# Patient Record
Sex: Female | Born: 1995 | Race: White | Hispanic: Yes | Marital: Single | Smoking: Never smoker
Health system: Southern US, Community
[De-identification: ages and names within clinical notes are randomized; demographics above are authoritative.]

## PROBLEM LIST (undated history)

## (undated) ENCOUNTER — Inpatient Hospital Stay (HOSPITAL_COMMUNITY): Payer: Self-pay

## (undated) DIAGNOSIS — E039 Hypothyroidism, unspecified: Secondary | ICD-10-CM

## (undated) DIAGNOSIS — E079 Disorder of thyroid, unspecified: Secondary | ICD-10-CM

## (undated) HISTORY — PX: NO PAST SURGERIES: SHX2092

## (undated) HISTORY — DX: Hypothyroidism, unspecified: E03.9

---
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
## 2015-09-18 ENCOUNTER — Encounter (HOSPITAL_COMMUNITY): Payer: Self-pay | Admitting: *Deleted

## 2015-09-18 ENCOUNTER — Inpatient Hospital Stay (HOSPITAL_COMMUNITY)
Admission: AD | Admit: 2015-09-18 | Discharge: 2015-09-18 | Disposition: A | Discharge status: Home / Self Care | Payer: Medicaid Other | Source: Ambulatory Visit | Attending: Family Medicine | Admitting: Family Medicine

## 2015-09-18 DIAGNOSIS — O9989 Other specified diseases and conditions complicating pregnancy, childbirth and the puerperium: Secondary | ICD-10-CM | POA: Diagnosis not present

## 2015-09-18 DIAGNOSIS — O26892 Other specified pregnancy related conditions, second trimester: Secondary | ICD-10-CM | POA: Diagnosis not present

## 2015-09-18 DIAGNOSIS — Z3A21 21 weeks gestation of pregnancy: Secondary | ICD-10-CM | POA: Diagnosis not present

## 2015-09-18 DIAGNOSIS — O99282 Endocrine, nutritional and metabolic diseases complicating pregnancy, second trimester: Secondary | ICD-10-CM | POA: Diagnosis not present

## 2015-09-18 DIAGNOSIS — M7989 Other specified soft tissue disorders: Secondary | ICD-10-CM

## 2015-09-18 DIAGNOSIS — Z3492 Encounter for supervision of normal pregnancy, unspecified, second trimester: Secondary | ICD-10-CM

## 2015-09-18 DIAGNOSIS — Z79899 Other long term (current) drug therapy: Secondary | ICD-10-CM | POA: Diagnosis not present

## 2015-09-18 DIAGNOSIS — E079 Disorder of thyroid, unspecified: Secondary | ICD-10-CM | POA: Insufficient documentation

## 2015-09-18 DIAGNOSIS — R609 Edema, unspecified: Secondary | ICD-10-CM | POA: Diagnosis present

## 2015-09-18 LAB — URINALYSIS, ROUTINE W REFLEX MICROSCOPIC
BILIRUBIN URINE: NEGATIVE
Glucose, UA: NEGATIVE mg/dL
HGB URINE DIPSTICK: NEGATIVE
KETONES UR: NEGATIVE mg/dL
NITRITE: NEGATIVE
PROTEIN: NEGATIVE mg/dL
Specific Gravity, Urine: 1.025 (ref 1.005–1.030)
pH: 6.5 (ref 5.0–8.0)

## 2015-09-18 LAB — URINE MICROSCOPIC-ADD ON: RBC / HPF: NONE SEEN RBC/hpf (ref 0–5)

## 2015-09-18 NOTE — MAU Note (Signed)
Hands and feet get swollen really bad.- thinks it is from her thyrod; going on for a month. Has been having cramps in her calves off and on the past month.  Has not started care, says they don't answer phone ([facility]).  Also has been having back pain.

## 2015-09-18 NOTE — Discharge Instructions (Signed)

## 2015-09-18 NOTE — MAU Provider Note (Signed)
History     CSN: [id number]  Arrival date and time: 09/18/15 1632     Chief Complaint  Patient presents with  . edema in hands and feet  . leg cramps  . Back Pain   HPI  Pt is a 20 y/o G1P0 at 21 wks. She reports several weeks of swelling in the hands face and legs. She reports the swelling is worse in the AM, but gets better as she moves around. No shortness of breath associated with her symptoms. She reports his been going on for some time. She has slightly skin so and about her thyroid disease. She however reports she has not been on medication for many years. She has not yet established care. One question she says gives [facility] both [facility] and both keeps saying there no longer taking patient's. She has no transportation to any other OB provider.  OB History    Gravida Para Term Preterm AB Living   1             SAB TAB Ectopic Multiple Live Births                  Past Medical History:  Diagnosis Date  . Thyroid disease     History reviewed. No pertinent surgical history.  History reviewed. No pertinent family history.  Social History  Substance Use Topics  . Smoking status: Never Smoker  . Smokeless tobacco: Never Used  . Alcohol use No    Allergies: No Known Allergies  Prescriptions Prior to Admission  Medication Sig Dispense Refill Last Dose  . levothyroxine (SYNTHROID, LEVOTHROID) 125 MCG tablet Take 125 mcg by mouth daily before breakfast.   09/17/2015 at Unknown time  . Prenatal Vit-Fe Fumarate-FA (PRENATAL MULTIVITAMIN) TABS tablet Take 1 tablet by mouth daily at 12 noon.   09/18/2015 at Unknown time    Review of Systems  Constitutional: Negative for chills and fever.  HENT: Negative for congestion.   Eyes: Negative for blurred vision and double vision.  Respiratory: Negative for cough, hemoptysis and sputum production.   Cardiovascular: Negative for chest pain  and palpitations.  Gastrointestinal: Negative for abdominal pain, heartburn, nausea and vomiting.  Genitourinary: Negative for dysuria and urgency.  Musculoskeletal: Negative for back pain and myalgias.  Skin: Negative for itching and rash.  Neurological: Negative for dizziness, tingling and headaches.  Endo/Heme/Allergies: Negative for environmental allergies. Does not bruise/bleed easily.  Psychiatric/Behavioral: Negative for depression and suicidal ideas.   Physical Exam   Blood pressure 100/58, pulse 86, temperature 98.1 F (36.7 C), temperature source Oral, resp. rate 16.  Physical Exam  Vitals reviewed. Constitutional: She is oriented to person, place, and time. She appears well-developed and well-nourished.  HENT:  Head: Normocephalic and atraumatic.  Cardiovascular: Normal rate, regular rhythm and normal heart sounds.   No murmur heard. Respiratory: Effort normal and breath sounds normal. No respiratory distress.  GI: Soft. Bowel sounds are normal. She exhibits no distension.  Gravid  Musculoskeletal:  1+ pitting nonpitting edema of the bilateral lower extremities. Minimal swelling of the bilateral hands noted as well.  Neurological: She is alert and oriented to person, place, and time.  Skin: Skin is warm and dry.  Psychiatric: She has a normal mood and affect. Her behavior is normal.    MAU Course  Procedures  MDM In the MAU discussion was had with the patient about her physical condition and the  fact that she'll likely has swelling secondary to pregnancy. We discussed things she can do to help a little swelling including low-salt intake and keeping her legs elevated. Patient voiced understanding and was satisfied with this answered.  Assessment and Plan  Lower extremity swelling bilateral: This is likely secondary to pregnancy. Recommended elevation of extremities when not in use and low-salt diet. Did discuss with patient need to establish care. After discussion she  said she is having trouble getting in touch with the OB providers. A message was sent to [facility] to decipher whether she can be seen here versus at the health department.  Ernestina Pennaicholas Jozette Castrellon 09/18/2015, 6:22 PM

## 2015-09-21 ENCOUNTER — Encounter: Payer: Self-pay | Admitting: *Deleted

## 2015-10-02 ENCOUNTER — Encounter: Payer: Medicaid Other | Admitting: Family Medicine

## 2015-10-18 ENCOUNTER — Ambulatory Visit (INDEPENDENT_AMBULATORY_CARE_PROVIDER_SITE_OTHER): Payer: Medicaid Other | Admitting: Certified Nurse Midwife

## 2015-10-18 ENCOUNTER — Encounter: Payer: Self-pay | Admitting: Certified Nurse Midwife

## 2015-10-18 VITALS — BP 105/64 | HR 62 | Ht 62.0 in | Wt 162.0 lb

## 2015-10-18 DIAGNOSIS — Z3402 Encounter for supervision of normal first pregnancy, second trimester: Secondary | ICD-10-CM

## 2015-10-18 DIAGNOSIS — O99282 Endocrine, nutritional and metabolic diseases complicating pregnancy, second trimester: Secondary | ICD-10-CM

## 2015-10-18 DIAGNOSIS — O0932 Supervision of pregnancy with insufficient antenatal care, second trimester: Secondary | ICD-10-CM

## 2015-10-18 DIAGNOSIS — R609 Edema, unspecified: Secondary | ICD-10-CM

## 2015-10-18 DIAGNOSIS — Z3689 Encounter for other specified antenatal screening: Secondary | ICD-10-CM

## 2015-10-18 DIAGNOSIS — O0930 Supervision of pregnancy with insufficient antenatal care, unspecified trimester: Secondary | ICD-10-CM | POA: Insufficient documentation

## 2015-10-18 DIAGNOSIS — O0990 Supervision of high risk pregnancy, unspecified, unspecified trimester: Secondary | ICD-10-CM | POA: Insufficient documentation

## 2015-10-18 DIAGNOSIS — E039 Hypothyroidism, unspecified: Secondary | ICD-10-CM | POA: Insufficient documentation

## 2015-10-18 DIAGNOSIS — O99280 Endocrine, nutritional and metabolic diseases complicating pregnancy, unspecified trimester: Secondary | ICD-10-CM

## 2015-10-18 DIAGNOSIS — Z23 Encounter for immunization: Secondary | ICD-10-CM | POA: Diagnosis present

## 2015-10-18 LAB — POCT URINALYSIS DIP (DEVICE)
BILIRUBIN URINE: NEGATIVE
Glucose, UA: NEGATIVE mg/dL
Hgb urine dipstick: NEGATIVE
KETONES UR: NEGATIVE mg/dL
NITRITE: NEGATIVE
PH: 6.5 (ref 5.0–8.0)
Protein, ur: NEGATIVE mg/dL
SPECIFIC GRAVITY, URINE: 1.02 (ref 1.005–1.030)
Urobilinogen, UA: 0.2 mg/dL (ref 0.0–1.0)

## 2015-10-18 LAB — TSH

## 2015-10-18 NOTE — Progress Notes (Signed)
New OB packet given

## 2015-10-18 NOTE — Progress Notes (Addendum)
Subjective:  Terri Glenn is a 20 y.o. G1P0000 at 7466w2d being seen today for initial prenatal care.  She is currently monitored for the following issues for this low-risk pregnancy and has Supervision of normal first pregnancy in second trimester; Hypothyroidism affecting pregnancy, antepartum; Dependent edema; and Late prenatal care affecting pregnancy, antepartum on her problem list.  Patient reports edema in feet x1 month, seen in MAU.Marland Kitchen.  Contractions: Not present. Vag. Bleeding: None.  Movement: Present. Denies leaking of fluid.   The following portions of the patient's history were reviewed and updated as appropriate: allergies, current medications, past family history, past medical history, past social history, past surgical history and problem list. Problem list updated.  Objective:   Vitals:   10/18/15 0808 10/18/15 0809  BP: 105/64   Pulse: 62   Weight: 162 lb (73.5 kg)   Height:  5\' 2"  (1.575 m)    Fetal Status: Fetal Heart Rate (bpm): 142 Fundal Height: 28 cm Movement: Present     General:  Alert, oriented and cooperative. Patient is in no acute distress.  Skin: Skin is warm and dry. No rash noted.   Cardiovascular: Normal heart rate noted, RRR  Respiratory: Normal respiratory effort, no problems with respiration noted, CTAB  Abdomen: Soft, gravid, appropriate for gestational age. Pain/Pressure: Present     Pelvic: Vag. Bleeding: None     Cervical exam deferred        Extremities: Normal range of motion.  Edema: Mild pitting, slight indentation  Mental Status: Normal mood and affect. Normal behavior. Normal judgment and thought content.   Urinalysis: Urine Protein: Negative Urine Glucose: Negative  Assessment and Plan:  Pregnancy: G1P0000 at 1166w2d  1. Supervision of normal first pregnancy in second trimester - Prenatal Profile - Hemoglobinopathy Evaluation - Culture, OB Urine - Pain Mgmt, Profile 6 Conf w/o mM, U - Flu Vaccine QUAD 36+ mos IM (Fluarix, Quad  PF) - watch weight gain  2. Encounter for fetal anatomic survey  - US MFM OB COMP + 14 WK; Future  3. Hypothyroidism affecting pregnancy, antepartum, second trimester - TSH  4. Dependent edema -increase water intake -elevate legs -compression stockings  5. Late prenatal care  Preterm labor symptoms and general obstetric precautions including but not limited to vaginal bleeding, contractions, leaking of fluid and fetal movement were reviewed in detail with the patient. Please refer to After Visit Summary for other counseling recommendations.  Return in about 2 weeks (around 11/01/2015).   Donette LarryMelanie Seara Hinesley, CNM

## 2015-10-18 NOTE — Addendum Note (Signed)
Addended by: Kathee DeltonHILLMAN, Andrick Rust L on: 10/18/2015 10:21 AM   Modules accepted: Orders

## 2015-10-19 LAB — PAIN MGMT, PROFILE 6 CONF W/O MM, U
6 ACETYLMORPHINE: NEGATIVE ng/mL (ref <10)
AMPHETAMINES: NEGATIVE ng/mL (ref <500)
Alcohol Metabolites: NEGATIVE ng/mL (ref <500)
Barbiturates: NEGATIVE ng/mL (ref <300)
Benzodiazepines: NEGATIVE ng/mL (ref <100)
COCAINE METABOLITE: NEGATIVE ng/mL (ref <150)
Creatinine: 93.2 mg/dL (ref >=20.0)
MARIJUANA METABOLITE: NEGATIVE ng/mL (ref <20)
METHADONE METABOLITE: NEGATIVE ng/mL (ref <100)
OPIATES: NEGATIVE ng/mL (ref <100)
OXYCODONE: NEGATIVE ng/mL (ref <100)
Oxidant: NEGATIVE ug/mL (ref <200)
PHENCYCLIDINE: NEGATIVE ng/mL (ref <25)
PLEASE NOTE: 0
pH: 6.67 (ref 4.5–9.0)

## 2015-10-19 LAB — PRENATAL PROFILE (SOLSTAS)
Antibody Screen: NEGATIVE
Basophils Absolute: 0 cells/uL (ref 0–200)
Basophils Relative: 0 %
EOS PCT: 2 %
Eosinophils Absolute: 136 cells/uL (ref 15–500)
HEMATOCRIT: 30.2 % — AB (ref 35.0–45.0)
HEMOGLOBIN: 10 g/dL — AB (ref 11.7–15.5)
HEP B S AG: NEGATIVE
HIV 1&2 Ab, 4th Generation: NONREACTIVE
LYMPHS ABS: 1904 {cells}/uL (ref 850–3900)
Lymphocytes Relative: 28 %
MCH: 33.8 pg — AB (ref 27.0–33.0)
MCHC: 33.1 g/dL (ref 32.0–36.0)
MCV: 102 fL — ABNORMAL HIGH (ref 80.0–100.0)
MONO ABS: 476 {cells}/uL (ref 200–950)
MPV: 10.5 fL (ref 7.5–12.5)
Monocytes Relative: 7 %
NEUTROS PCT: 63 %
Neutro Abs: 4284 cells/uL (ref 1500–7800)
Platelets: 244 10*3/uL (ref 140–400)
RBC: 2.96 MIL/uL — AB (ref 3.80–5.10)
RDW: 13.9 % (ref 11.0–15.0)
Rh Type: POSITIVE
Rubella: <0.9 Index (ref <0.90)
WBC: 6.8 10*3/uL (ref 3.8–10.8)

## 2015-10-19 LAB — CULTURE, OB URINE

## 2015-10-19 LAB — GLUCOSE TOLERANCE, 1 HOUR (50G) W/O FASTING: Glucose, 1 Hr, gestational: 118 mg/dL (ref <140)

## 2015-10-22 LAB — HEMOGLOBINOPATHY EVALUATION
HEMATOCRIT: 30.2 % — AB (ref 35.0–45.0)
HEMOGLOBIN: 10 g/dL — AB (ref 11.7–15.5)
HGB A2 QUANT: 2.4 % (ref 1.8–3.5)
HGB A: 96.6 % (ref >96.0)
Hgb F Quant: <1 % (ref <2.0)
MCH: 33.8 pg — ABNORMAL HIGH (ref 27.0–33.0)
MCV: 102 fL — ABNORMAL HIGH (ref 80.0–100.0)
RBC: 2.96 MIL/uL — ABNORMAL LOW (ref 3.80–5.10)
RDW: 13.9 % (ref 11.0–15.0)

## 2015-10-26 ENCOUNTER — Other Ambulatory Visit: Payer: Self-pay | Admitting: Certified Nurse Midwife

## 2015-10-26 ENCOUNTER — Other Ambulatory Visit: Payer: Medicaid Other

## 2015-10-26 ENCOUNTER — Ambulatory Visit (HOSPITAL_COMMUNITY)
Admission: RE | Admit: 2015-10-26 | Discharge: 2015-10-26 | Disposition: A | Discharge status: Home / Self Care | Payer: Medicaid Other | Source: Ambulatory Visit | Attending: Certified Nurse Midwife | Admitting: Certified Nurse Midwife

## 2015-10-26 ENCOUNTER — Telehealth: Payer: Self-pay | Admitting: *Deleted

## 2015-10-26 DIAGNOSIS — O99283 Endocrine, nutritional and metabolic diseases complicating pregnancy, third trimester: Secondary | ICD-10-CM | POA: Diagnosis not present

## 2015-10-26 DIAGNOSIS — O99282 Endocrine, nutritional and metabolic diseases complicating pregnancy, second trimester: Secondary | ICD-10-CM

## 2015-10-26 DIAGNOSIS — O0992 Supervision of high risk pregnancy, unspecified, second trimester: Secondary | ICD-10-CM

## 2015-10-26 DIAGNOSIS — Z3A26 26 weeks gestation of pregnancy: Secondary | ICD-10-CM

## 2015-10-26 DIAGNOSIS — O0932 Supervision of pregnancy with insufficient antenatal care, second trimester: Secondary | ICD-10-CM

## 2015-10-26 DIAGNOSIS — Z36 Encounter for antenatal screening of mother: Secondary | ICD-10-CM | POA: Insufficient documentation

## 2015-10-26 DIAGNOSIS — E039 Hypothyroidism, unspecified: Secondary | ICD-10-CM

## 2015-10-26 DIAGNOSIS — Z1389 Encounter for screening for other disorder: Secondary | ICD-10-CM

## 2015-10-26 DIAGNOSIS — Z3A28 28 weeks gestation of pregnancy: Secondary | ICD-10-CM | POA: Insufficient documentation

## 2015-10-26 DIAGNOSIS — E079 Disorder of thyroid, unspecified: Secondary | ICD-10-CM

## 2015-10-26 DIAGNOSIS — Z3689 Encounter for other specified antenatal screening: Secondary | ICD-10-CM

## 2015-10-26 LAB — T4, FREE: FREE T4: 0.3 ng/dL — AB (ref 0.8–1.4)

## 2015-10-26 MED ORDER — LEVOTHYROXINE SODIUM 100 MCG PO TABS: 100.0000 ug | ORAL_TABLET | Freq: Every day | ORAL | 1 refills | Status: DC

## 2015-10-26 NOTE — Telephone Encounter (Signed)
Called pt w/interpreter Terri Glenn and spoke w/pt's mother who stated that [facility]. I went to MFM to speak w/pt and advised her of abnormal test result requiring additional labs needed and that she needed to start on medication for her thyroid.  Pt voiced understanding and agreed to come to office today for lab draw.  Rx sent to her pharmacy.

## 2015-10-27 LAB — T3: T3, Total: 26 ng/dL — ABNORMAL LOW (ref 86–192)

## 2015-10-30 ENCOUNTER — Other Ambulatory Visit (HOSPITAL_COMMUNITY): Payer: Self-pay | Admitting: *Deleted

## 2015-10-30 DIAGNOSIS — E039 Hypothyroidism, unspecified: Secondary | ICD-10-CM

## 2015-10-30 DIAGNOSIS — O99280 Endocrine, nutritional and metabolic diseases complicating pregnancy, unspecified trimester: Principal | ICD-10-CM

## 2015-11-06 ENCOUNTER — Ambulatory Visit (INDEPENDENT_AMBULATORY_CARE_PROVIDER_SITE_OTHER): Payer: Medicaid Other | Admitting: Student

## 2015-11-06 VITALS — BP 109/64 | HR 67 | Wt 166.2 lb

## 2015-11-06 DIAGNOSIS — Z283 Underimmunization status: Secondary | ICD-10-CM

## 2015-11-06 DIAGNOSIS — O99280 Endocrine, nutritional and metabolic diseases complicating pregnancy, unspecified trimester: Secondary | ICD-10-CM

## 2015-11-06 DIAGNOSIS — O99283 Endocrine, nutritional and metabolic diseases complicating pregnancy, third trimester: Secondary | ICD-10-CM

## 2015-11-06 DIAGNOSIS — O0990 Supervision of high risk pregnancy, unspecified, unspecified trimester: Secondary | ICD-10-CM

## 2015-11-06 DIAGNOSIS — O09899 Supervision of other high risk pregnancies, unspecified trimester: Secondary | ICD-10-CM | POA: Insufficient documentation

## 2015-11-06 DIAGNOSIS — O9989 Other specified diseases and conditions complicating pregnancy, childbirth and the puerperium: Secondary | ICD-10-CM

## 2015-11-06 DIAGNOSIS — E039 Hypothyroidism, unspecified: Secondary | ICD-10-CM

## 2015-11-06 DIAGNOSIS — Z2839 Other underimmunization status: Secondary | ICD-10-CM

## 2015-11-06 NOTE — Progress Notes (Signed)
   PRENATAL VISIT NOTE  Subjective:  Terri Glenn is a 20 y.o. G1P0000 at 6759w0d being seen today for ongoing prenatal care.  She is currently monitored for the following issues for this high-risk pregnancy and has Supervision of high risk pregnancy, antepartum; Hypothyroidism affecting pregnancy, antepartum; Dependent edema; Late prenatal care affecting pregnancy, antepartum; and Rubella non-immune status, antepartum on her problem list.  Patient reports no complaints.  Contractions: Not present.  .  Movement: Present. Denies leaking of fluid.   The following portions of the patient's history were reviewed and updated as appropriate: allergies, current medications, past family history, past medical history, past social history, past surgical history and problem list. Problem list updated.  Objective:   Vitals:   11/06/15 1119  BP: 109/64  Pulse: 67  Weight: 166 lb 3.2 oz (75.4 kg)    Fetal Status: Fetal Heart Rate (bpm): 134   Movement: Present   Fundal height 29 cm  General:  Alert, oriented and cooperative. Patient is in no acute distress.  Skin: Skin is warm and dry. No rash noted.   Cardiovascular: Normal heart rate noted  Respiratory: Normal respiratory effort, no problems with respiration noted  Abdomen: Soft, gravid, appropriate for gestational age. Pain/Pressure: Present     Pelvic:  Cervical exam deferred        Extremities: Normal range of motion.   BLE 1+  Mental Status: Normal mood and affect. Normal behavior. Normal judgment and thought content.   Urinalysis:      Assessment and Plan:  Pregnancy: G1P0000 at 10059w0d  1. Supervision of high risk pregnancy, antepartum   2. Hypothyroidism affecting pregnancy, antepartum -pt currently taking synthroid -needs repeat TSH next visit  3. Rubella non-immune status, antepartum -needs postpartum  Preterm labor symptoms and general obstetric precautions including but not limited to vaginal bleeding, contractions,  leaking of fluid and fetal movement were reviewed in detail with the patient. Please refer to After Visit Summary for other counseling recommendations.  Return in about 2 weeks (around 11/20/2015) for Routine OB.  Judeth HornErin [place] Severs, NP

## 2015-11-06 NOTE — Patient Instructions (Signed)
Hypothyroidism and Pregnancy Hypothyroidism is a condition that develops if you have an underactive thyroid gland. The thyroid is a small, butterfly-shaped gland in your neck, and it is located in front of your windpipe. It makes the thyroid hormones triiodothyronine (T3) and thyroxine (T4). These hormones play an important role in regulating your breathing, heart rate, menstrual cycle, body temperature, and other bodily functions. If you have hypothyroidism, your thyroid gland does not produce enough thyroid hormones. When you are pregnant, your body uses more thyroid hormones. This can cause mild hypothyroidism to get worse. Hypothyroidism during pregnancy can lead to complications, including high blood pressure that develops after the 20th week of pregnancy (preeclampsia). Hypothyroidism can also affect your baby. Babies need thyroid hormone from their mothers for normal growth and brain development. Babies born to mothers with hypothyroidism during pregnancy may be born prematurely and have lower mental abilities. SYMPTOMS Symptoms of hypothyroidism can develop slowly. Symptoms include:   Fatigue.  Weight gain.  Constipation.  Feeling cold more often than others do.  Muscle aches. DIAGNOSIS Your health care provider may suspect hypothyroidism based on your symptoms. The health care provider will also do a physical exam to check your neck. He or she will do this while you swallow so that it will be easier to feel your thyroid gland. You may also have tests to confirm the diagnosis, including:   Blood tests.  An imaging study using sound waves and a computer (ultrasound). TREATMENT Hypothyroid treatment during pregnancy includes:   Monitoring. If you have mild hypothyroidism, your health care provider will monitor your thyroid hormone levels closely to watch for any changes.  Medicine prescribed by your health care provider to control your thyroid hormone levels. HOME CARE  INSTRUCTIONS  Take medicines only as directed by your health care provider. Check with your health care provider before taking any hypothyroid medicines that were prescribed before you became pregnant. Many are safe, but some treatments for hypothyroidism may have to be stopped during pregnancy.  Some women need extra iodine during pregnancy. Ask your health care provider whether you should:  Get more iodine in your diet.  Take a prenatal vitamin containing iodine.  Take iodine supplements. SEEK MEDICAL CARE IF:  You notice the onset of hypothyroidism symptoms that you did not have before.  You gain more than 5 lb (2.3 kg) in 1 week. For women at a normal weight, it is normal to gain about 1 pound per week during pregnancy.  You have a lump in your neck.  You have a scratchy throat or difficulty speaking that lasts longer than a month and is not related to a cold.  You have a hard time swallowing. SEEK IMMEDIATE MEDICAL CARE IF:  Your baby is less active than normal. You may be asked to perform kick counts to monitor your baby's movements. If your baby moves fewer than 10 times in 2 hours during a period when the baby is usually active (typically in the evening), you should see your health care provider right away.  Your baby stops moving completely.  You develop muscle cramps.  You have belly pain.  You have heavy bleeding.  You develop a fever or chills.  You have a very bad headache or vision problems.  You develop swelling in your legs and ankles.   This information is not intended to replace advice given to you by your health care provider. Make sure you discuss any questions you have with your health care provider.  Document Released: 11/10/2006 Document Revised: 02/03/2014 Document Reviewed: 06/15/2013 Elsevier Interactive Patient Education [employer]

## 2015-11-23 ENCOUNTER — Ambulatory Visit (HOSPITAL_COMMUNITY)
Admission: RE | Admit: 2015-11-23 | Discharge: 2015-11-23 | Disposition: A | Discharge status: Home / Self Care | Payer: Medicaid Other | Source: Ambulatory Visit | Attending: Certified Nurse Midwife | Admitting: Certified Nurse Midwife

## 2015-11-23 ENCOUNTER — Encounter (HOSPITAL_COMMUNITY): Payer: Self-pay

## 2015-11-23 DIAGNOSIS — O99283 Endocrine, nutritional and metabolic diseases complicating pregnancy, third trimester: Secondary | ICD-10-CM | POA: Insufficient documentation

## 2015-11-23 DIAGNOSIS — Z362 Encounter for other antenatal screening follow-up: Secondary | ICD-10-CM | POA: Insufficient documentation

## 2015-11-23 DIAGNOSIS — O9989 Other specified diseases and conditions complicating pregnancy, childbirth and the puerperium: Secondary | ICD-10-CM

## 2015-11-23 DIAGNOSIS — Z283 Underimmunization status: Secondary | ICD-10-CM

## 2015-11-23 DIAGNOSIS — E039 Hypothyroidism, unspecified: Secondary | ICD-10-CM | POA: Diagnosis not present

## 2015-11-23 DIAGNOSIS — O0990 Supervision of high risk pregnancy, unspecified, unspecified trimester: Secondary | ICD-10-CM

## 2015-11-23 DIAGNOSIS — R609 Edema, unspecified: Secondary | ICD-10-CM

## 2015-11-23 DIAGNOSIS — Z3A32 32 weeks gestation of pregnancy: Secondary | ICD-10-CM | POA: Diagnosis not present

## 2015-11-23 DIAGNOSIS — O99280 Endocrine, nutritional and metabolic diseases complicating pregnancy, unspecified trimester: Secondary | ICD-10-CM

## 2015-11-23 DIAGNOSIS — O4103X0 Oligohydramnios, third trimester, not applicable or unspecified: Secondary | ICD-10-CM | POA: Diagnosis not present

## 2015-11-23 DIAGNOSIS — Z2839 Other underimmunization status: Secondary | ICD-10-CM

## 2015-11-23 DIAGNOSIS — O0930 Supervision of pregnancy with insufficient antenatal care, unspecified trimester: Secondary | ICD-10-CM

## 2015-11-26 ENCOUNTER — Ambulatory Visit (INDEPENDENT_AMBULATORY_CARE_PROVIDER_SITE_OTHER): Payer: Medicaid Other | Admitting: Obstetrics and Gynecology

## 2015-11-26 VITALS — BP 99/62 | HR 64 | Wt 165.6 lb

## 2015-11-26 DIAGNOSIS — E032 Hypothyroidism due to medicaments and other exogenous substances: Secondary | ICD-10-CM

## 2015-11-26 DIAGNOSIS — O99283 Endocrine, nutritional and metabolic diseases complicating pregnancy, third trimester: Secondary | ICD-10-CM

## 2015-11-26 DIAGNOSIS — O99013 Anemia complicating pregnancy, third trimester: Secondary | ICD-10-CM

## 2015-11-26 DIAGNOSIS — O329XX0 Maternal care for malpresentation of fetus, unspecified, not applicable or unspecified: Secondary | ICD-10-CM | POA: Insufficient documentation

## 2015-11-26 DIAGNOSIS — D531 Other megaloblastic anemias, not elsewhere classified: Secondary | ICD-10-CM

## 2015-11-26 LAB — VITAMIN B12: Vitamin B-12: 218 pg/mL (ref 200–1100)

## 2015-11-26 LAB — TSH: TSH: 64.85 mIU/L — ABNORMAL HIGH

## 2015-11-26 LAB — FOLATE: Folate: 20.4 ng/mL (ref >5.4)

## 2015-11-26 NOTE — Progress Notes (Signed)
Prenatal Visit Note Date: 11/26/2015 Clinic: [facility]  Subjective:  Terri Glenn is a 20 y.o. G1P0000 at 4367w6d being seen today for ongoing prenatal care.  She is currently monitored for the following issues for this low-risk pregnancy and has Supervision of high risk pregnancy, antepartum; Hypothyroidism affecting pregnancy, antepartum; Dependent edema; Late prenatal care affecting pregnancy, antepartum; and Rubella non-immune status, antepartum on her problem list.  Patient reports no complaints.   Contractions: Not present.  .  Movement: Present. Denies leaking of fluid.   The following portions of the patient's history were reviewed and updated as appropriate: allergies, current medications, past family history, past medical history, past social history, past surgical history and problem list. Problem list updated.  Objective:   Vitals:   11/26/15 1111  BP: 99/62  Pulse: 64  Weight: 165 lb 9.6 oz (75.1 kg)    Fetal Status: Fetal Heart Rate (bpm): 136 Fundal Height: 33 cm Movement: Present  Presentation: Homero FellersFrank Breech  General:  Alert, oriented and cooperative. Patient is in no acute distress.  Skin: Skin is warm and dry. No rash noted.   Cardiovascular: Normal heart rate noted  Respiratory: Normal respiratory effort, no problems with respiration noted  Abdomen: Soft, gravid, appropriate for gestational age. Pain/Pressure: Present     Pelvic:  Cervical exam deferred        Extremities: Normal range of motion.     Mental Status: Normal mood and affect. Normal behavior. Normal judgment and thought content.   Urinalysis:      Assessment and Plan:  Pregnancy: G1P0000 at 3867w6d  1. 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 F/u TSH and adjust prn - TSH  2. Megaloblastic anemia - B12 - Folate  3. Malpresentation Frank breech at normal growth scan on 10/27 and breech via leopolds today. F/u nv and d/w pt  potential plan of care.   Preterm labor symptoms and general obstetric precautions including but not limited to vaginal bleeding, contractions, leaking of fluid and fetal movement were reviewed in detail with the patient. Please refer to After Visit Summary for other counseling recommendations.  Return in about 2 weeks (around 12/10/2015).   [place] Bingharlie Keesha Pellum, MD

## 2015-11-27 ENCOUNTER — Other Ambulatory Visit: Payer: Self-pay | Admitting: Obstetrics and Gynecology

## 2015-11-27 MED ORDER — LEVOTHYROXINE SODIUM 25 MCG PO TABS: ORAL_TABLET | ORAL | 1 refills | Status: DC

## 2015-11-29 ENCOUNTER — Telehealth: Payer: Self-pay

## 2015-11-29 NOTE — Telephone Encounter (Signed)
Attempted to call patient but there was no answer or voicemail to leave a message. Make sure that she increases her synthroid to per day. i'll send in some tablets for her to the pharmacy.

## 2015-11-30 NOTE — Telephone Encounter (Signed)
Called patient to let her know of need to increase synthroid dose to total of 125 mcg. Prescription for extra 25 mcg can be picked up at her pharmacy. Patient voiced understanding.

## 2015-12-11 ENCOUNTER — Ambulatory Visit (INDEPENDENT_AMBULATORY_CARE_PROVIDER_SITE_OTHER): Payer: Medicaid Other | Admitting: Certified Nurse Midwife

## 2015-12-11 ENCOUNTER — Encounter: Payer: Medicaid Other | Admitting: Medical

## 2015-12-11 VITALS — BP 103/65 | HR 76 | Wt 165.8 lb

## 2015-12-11 DIAGNOSIS — Z23 Encounter for immunization: Secondary | ICD-10-CM

## 2015-12-11 DIAGNOSIS — O99280 Endocrine, nutritional and metabolic diseases complicating pregnancy, unspecified trimester: Principal | ICD-10-CM

## 2015-12-11 DIAGNOSIS — E039 Hypothyroidism, unspecified: Secondary | ICD-10-CM

## 2015-12-11 DIAGNOSIS — O99283 Endocrine, nutritional and metabolic diseases complicating pregnancy, third trimester: Secondary | ICD-10-CM

## 2015-12-11 DIAGNOSIS — O0990 Supervision of high risk pregnancy, unspecified, unspecified trimester: Secondary | ICD-10-CM

## 2015-12-11 MED ORDER — TETANUS-DIPHTH-ACELL PERTUSSIS 5-2.5-18.5 LF-MCG/0.5 IM SUSP
0.5000 mL | Freq: Once | INTRAMUSCULAR | Status: AC
  Administered 2015-12-11: 0.5 mL via INTRAMUSCULAR

## 2015-12-11 NOTE — Progress Notes (Signed)
Subjective:  Terri Glenn is a 20 y.o. G1P0000 at 510w0d being seen today for ongoing prenatal care.  She is currently monitored for the following issues for this low-risk pregnancy and has Supervision of high risk pregnancy, antepartum; Hypothyroidism affecting pregnancy, antepartum; Dependent edema; Late prenatal care affecting pregnancy, antepartum; Rubella non-immune status, antepartum; and Malpresentation before onset of labor on her problem list.  Patient reports no complaints.  Contractions: Not present. Vag. Bleeding: None.  Movement: Present. Denies leaking of fluid.   The following portions of the patient's history were reviewed and updated as appropriate: allergies, current medications, past family history, past medical history, past social history, past surgical history and problem list. Problem list updated.  Objective:   Vitals:   12/11/15 1448  BP: 103/65  Pulse: 76  Weight: 165 lb 12.8 oz (75.2 kg)    Fetal Status: Fetal Heart Rate (bpm): 141 Fundal Height: 35 cm Movement: Present  Presentation: Homero FellersFrank Breech  General:  Alert, oriented and cooperative. Patient is in no acute distress.  Skin: Skin is warm and dry. No rash noted.   Cardiovascular: Normal heart rate noted  Respiratory: Normal respiratory effort, no problems with respiration noted  Abdomen: Soft, gravid, appropriate for gestational age. Pain/Pressure: Present     Pelvic: Vag. Bleeding: None     Cervical exam deferred        Extremities: Normal range of motion.  Edema: None  Mental Status: Normal mood and affect. Normal behavior. Normal judgment and thought content.   Urinalysis:      Assessment and Plan:  Pregnancy: G1P0000 at 810w0d  1. Hypothyroidism affecting pregnancy, antepartum - TSH 64.85, dose increased to 125 mcg (2 weeks ago)  2. Supervision of high risk pregnancy, antepartum  3.  Fetal malpresentation - recommend exercises from spinning babies website - discussed options if breech  position persists: ECV vs CS  Preterm labor symptoms and general obstetric precautions including but not limited to vaginal bleeding, contractions, leaking of fluid and fetal movement were reviewed in detail with the patient. Please refer to After Visit Summary for other counseling recommendations.  Return in about 1 week (around 12/18/2015).   Donette LarryMelanie Marcie Shearon, CNM

## 2015-12-26 ENCOUNTER — Other Ambulatory Visit (HOSPITAL_COMMUNITY)
Admission: RE | Admit: 2015-12-26 | Discharge: 2015-12-26 | Disposition: A | Discharge status: Home / Self Care | Payer: Medicaid Other | Source: Ambulatory Visit | Attending: Family Medicine | Admitting: Family Medicine

## 2015-12-26 ENCOUNTER — Ambulatory Visit (INDEPENDENT_AMBULATORY_CARE_PROVIDER_SITE_OTHER): Payer: Medicaid Other | Admitting: Family Medicine

## 2015-12-26 VITALS — BP 111/65 | HR 79 | Wt 170.0 lb

## 2015-12-26 DIAGNOSIS — O99283 Endocrine, nutritional and metabolic diseases complicating pregnancy, third trimester: Secondary | ICD-10-CM | POA: Diagnosis present

## 2015-12-26 DIAGNOSIS — Z3689 Encounter for other specified antenatal screening: Secondary | ICD-10-CM | POA: Diagnosis not present

## 2015-12-26 DIAGNOSIS — O321XX0 Maternal care for breech presentation, not applicable or unspecified: Secondary | ICD-10-CM

## 2015-12-26 DIAGNOSIS — Z113 Encounter for screening for infections with a predominantly sexual mode of transmission: Secondary | ICD-10-CM | POA: Insufficient documentation

## 2015-12-26 DIAGNOSIS — E039 Hypothyroidism, unspecified: Secondary | ICD-10-CM

## 2015-12-26 DIAGNOSIS — O0990 Supervision of high risk pregnancy, unspecified, unspecified trimester: Secondary | ICD-10-CM

## 2015-12-26 NOTE — Progress Notes (Addendum)
   PRENATAL VISIT NOTE  Subjective:  Terri Glenn is a 20 y.o. G1P0000 at 480w1d being seen today for ongoing prenatal care.  She is currently monitored for the following issues for this low-risk pregnancy and has Supervision of high risk pregnancy, antepartum; Hypothyroidism affecting pregnancy, antepartum; Dependent edema; Late prenatal care affecting pregnancy, antepartum; Rubella non-immune status, antepartum; and Malpresentation before onset of labor on her problem list.  Patient reports no complaints.  Contractions: Not present.  .  Movement: Present. Denies leaking of fluid.   The following portions of the patient's history were reviewed and updated as appropriate: allergies, current medications, past family history, past medical history, past social history, past surgical history and problem list. Problem list updated.  Objective:   Vitals:   12/26/15 1605  BP: 111/65  Pulse: 79  Weight: 170 lb (77.1 kg)    Fetal Status: Fetal Heart Rate (bpm): 139 Fundal Height: 36 cm Movement: Present  Presentation: Homero FellersFrank Breech  General:  Alert, oriented and cooperative. Patient is in no acute distress.  Skin: Skin is warm and dry. No rash noted.   Cardiovascular: Normal heart rate noted  Respiratory: Normal respiratory effort, no problems with respiration noted  Abdomen: Soft, gravid, appropriate for gestational age. Pain/Pressure: Present     Pelvic:  Cervical exam performed Dilation: 1.5 Effacement (%): 80 Station: -2  Extremities: Normal range of motion.     Mental Status: Normal mood and affect. Normal behavior. Normal judgment and thought content.   Limited OB u/s shows breech presentation Assessment and Plan:  Pregnancy: G1P0000 at 7180w1d  1. Supervision of high risk pregnancy, antepartum - Culture, beta strep (group b only) - GC/Chlamydia probe amp ([place])not at [facility]  2. Breech presentation with antenatal problem, single or unspecified fetus Offered ECV--patient  will consider and call back to let us know her decision.   Term labor symptoms and general obstetric precautions including but not limited to vaginal bleeding, contractions, leaking of fluid and fetal movement were reviewed in detail with the patient. Please refer to After Visit Summary for other counseling recommendations.  Return in about 1 week (around 01/02/2016).   Reva Boresanya S Livi Mcgann, MD

## 2015-12-26 NOTE — Patient Instructions (Signed)
Third Trimester of Pregnancy The third trimester is from week 29 through week 40 (months 7 through 9). The third trimester is a time when the unborn baby (fetus) is growing rapidly. At the end of the ninth month, the fetus is about 20 inches in length and weighs 6-10 pounds. Body changes during your third trimester Your body goes through many changes during pregnancy. The changes vary from woman to woman. During the third trimester:  Your weight will continue to increase. You can expect to gain 25-35 pounds (11-16 kg) by the end of the pregnancy.  You may begin to get stretch marks on your hips, abdomen, and breasts.  You may urinate more often because the fetus is moving lower into your pelvis and pressing on your bladder.  You may develop or continue to have heartburn. This is caused by increased hormones that slow down muscles in the digestive tract.  You may develop or continue to have constipation because increased hormones slow digestion and cause the muscles that push waste through your intestines to relax.  You may develop hemorrhoids. These are swollen veins (varicose veins) in the rectum that can itch or be painful.  You may develop swollen, bulging veins (varicose veins) in your legs.  You may have increased body aches in the pelvis, back, or thighs. This is due to weight gain and increased hormones that are relaxing your joints.  You may have changes in your hair. These can include thickening of your hair, rapid growth, and changes in texture. Some women also have hair loss during or after pregnancy, or hair that feels dry or thin. Your hair will most likely return to normal after your baby is born.  Your breasts will continue to grow and they will continue to become tender. A yellow fluid (colostrum) may leak from your breasts. This is the first milk you are producing for your baby.  Your belly button may stick out.  You may notice more swelling in your hands, face, or  ankles.  You may have increased tingling or numbness in your hands, arms, and legs. The skin on your belly may also feel numb.  You may feel short of breath because of your expanding uterus.  You may have more problems sleeping. This can be caused by the size of your belly, increased need to urinate, and an increase in your body's metabolism.  You may notice the fetus "dropping," or moving lower in your abdomen.  You may have increased vaginal discharge.  Your cervix becomes thin and soft (effaced) near your due date. What to expect at prenatal visits You will have prenatal exams every 2 weeks until week 36. Then you will have weekly prenatal exams. During a routine prenatal visit:  You will be weighed to make sure you and the fetus are growing normally.  Your blood pressure will be taken.  Your abdomen will be measured to track your baby's growth.  The fetal heartbeat will be listened to.  Any test results from the previous visit will be discussed.  You may have a cervical check near your due date to see if you have effaced. At around 36 weeks, your health care provider will check your cervix. At the same time, your health care provider will also perform a test on the secretions of the vaginal tissue. This test is to determine if a type of bacteria, Group B streptococcus, is present. Your health care provider will explain this further. Your health care provider may ask you:    What your birth plan is.  How you are feeling.  If you are feeling the baby move.  If you have had any abnormal symptoms, such as leaking fluid, bleeding, severe headaches, or abdominal cramping.  If you are using any tobacco products, including cigarettes, chewing tobacco, and electronic cigarettes.  If you have any questions. Other tests or screenings that may be performed during your third trimester include:  Blood tests that check for low iron levels (anemia).  Fetal testing to check the health,  activity level, and growth of the fetus. Testing is done if you have certain medical conditions or if there are problems during the pregnancy.  Nonstress test (NST). This test checks the health of your baby to make sure there are no signs of problems, such as the baby not getting enough oxygen. During this test, a belt is placed around your belly. The baby is made to move, and its heart rate is monitored during movement. What is false labor? False labor is a condition in which you feel small, irregular tightenings of the muscles in the womb (contractions) that eventually go away. These are called Braxton Hicks contractions. Contractions may last for hours, days, or even weeks before true labor sets in. If contractions come at regular intervals, become more frequent, increase in intensity, or become painful, you should see your health care provider. What are the signs of labor?  Abdominal cramps.  Regular contractions that start at 10 minutes apart and become stronger and more frequent with time.  Contractions that start on the top of the uterus and spread down to the lower abdomen and back.  Increased pelvic pressure and dull back pain.  A watery or bloody mucus discharge that comes from the vagina.  Leaking of amniotic fluid. This is also known as your "water breaking." It could be a slow trickle or a gush. Let your doctor know if it has a color or strange odor. If you have any of these signs, call your health care provider right away, even if it is before your due date. Follow these instructions at home: Eating and drinking  Continue to eat regular, healthy meals.  Do not eat:  Raw meat or meat spreads.  Unpasteurized milk or cheese.  Unpasteurized juice.  Store-made salad.  Refrigerated smoked seafood.  Hot dogs or deli meat, unless they are piping hot.  More than 6 ounces of albacore tuna a week.  Shark, swordfish, king mackerel, or tile fish.  Store-made salads.  Raw  sprouts, such as mung bean or alfalfa sprouts.  Take prenatal vitamins as told by your health care provider.  Take 1000 mg of calcium daily as told by your health care provider.  If you develop constipation:  Take over-the-counter or prescription medicines.  Drink enough fluid to keep your urine clear or pale yellow.  Eat foods that are high in fiber, such as fresh fruits and vegetables, whole grains, and beans.  Limit foods that are high in fat and processed sugars, such as fried and sweet foods. Activity  Exercise only as directed by your health care provider. Healthy pregnant women should aim for 2 hours and 30 minutes of moderate exercise per week. If you experience any pain or discomfort while exercising, stop.  Avoid heavy lifting.  Do not exercise in extreme heat or humidity, or at high altitudes.  Wear low-heel, comfortable shoes.  Practice good posture.  Do not travel far distances unless it is absolutely necessary and only with the approval   of your health care provider.  Wear your seat belt at all times while in a car, on a bus, or on a plane.  Take frequent breaks and rest with your legs elevated if you have leg cramps or low back pain.  Do not use hot tubs, steam rooms, or saunas.  You may continue to have sex unless your health care provider tells you otherwise. Lifestyle  Do not use any products that contain nicotine or tobacco, such as cigarettes and e-cigarettes. If you need help quitting, ask your health care provider.  Do not drink alcohol.  Do not use any medicinal herbs or unprescribed drugs. These chemicals affect the formation and growth of the baby.  If you develop varicose veins:  Wear support pantyhose or compression stockings as told by your healthcare provider.  Elevate your feet for 15 minutes, 3-4 times a day.  Wear a supportive maternity bra to help with breast tenderness. General instructions  Take over-the-counter and prescription  medicines only as told by your health care provider. There are medicines that are either safe or unsafe to take during pregnancy.  Take warm sitz baths to soothe any pain or discomfort caused by hemorrhoids. Use hemorrhoid cream or witch hazel if your health care provider approves.  Avoid cat litter boxes and soil used by cats. These carry germs that can cause birth defects in the baby. If you have a cat, ask someone to clean the litter box for you.  To prepare for the arrival of your baby:  Take prenatal classes to understand, practice, and ask questions about the labor and delivery.  Make a trial run to the hospital.  Visit the hospital and tour the maternity area.  Arrange for maternity or paternity leave through employers.  Arrange for family and friends to take care of pets while you are in the hospital.  Purchase a rear-facing car seat and make sure you know how to install it in your car.  Pack your hospital bag.  Prepare the baby's nursery. Make sure to remove all pillows and stuffed animals from the baby's crib to prevent suffocation.  Visit your dentist if you have not gone during your pregnancy. Use a soft toothbrush to brush your teeth and be gentle when you floss.  Keep all prenatal follow-up visits as told by your health care provider. This is important. Contact a health care provider if:  You are unsure if you are in labor or if your water has broken.  You become dizzy.  You have mild pelvic cramps, pelvic pressure, or nagging pain in your abdominal area.  You have lower back pain.  You have persistent nausea, vomiting, or diarrhea.  You have an unusual or bad smelling vaginal discharge.  You have pain when you urinate. Get help right away if:  You have a fever.  You are leaking fluid from your vagina.  You have spotting or bleeding from your vagina.  You have severe abdominal pain or cramping.  You have rapid weight loss or weight gain.  You have  shortness of breath with chest pain.  You notice sudden or extreme swelling of your face, hands, ankles, feet, or legs.  Your baby makes fewer than 10 movements in 2 hours.  You have severe headaches that do not go away with medicine.  You have vision changes. Summary  The third trimester is from week 29 through week 40, months 7 through 9. The third trimester is a time when the unborn baby (fetus)   is growing rapidly.  During the third trimester, your discomfort may increase as you and your baby continue to gain weight. You may have abdominal, leg, and back pain, sleeping problems, and an increased need to urinate.  During the third trimester your breasts will keep growing and they will continue to become tender. A yellow fluid (colostrum) may leak from your breasts. This is the first milk you are producing for your baby.  False labor is a condition in which you feel small, irregular tightenings of the muscles in the womb (contractions) that eventually go away. These are called Braxton Hicks contractions. Contractions may last for hours, days, or even weeks before true labor sets in.  Signs of labor can include: abdominal cramps; regular contractions that start at 10 minutes apart and become stronger and more frequent with time; watery or bloody mucus discharge that comes from the vagina; increased pelvic pressure and dull back pain; and leaking of amniotic fluid. This information is not intended to replace advice given to you by your health care provider. Make sure you discuss any questions you have with your health care provider. Document Released: 01/07/2001 Document Revised: 06/21/2015 Document Reviewed: 03/16/2012 [employer] Interactive Patient Education  2017 [employer]   Breastfeeding Deciding to breastfeed is one of the best choices you can make for you and your baby. A change in hormones during pregnancy causes your breast tissue to grow and increases the number and size of your  milk ducts. These hormones also allow proteins, sugars, and fats from your blood supply to make breast milk in your milk-producing glands. Hormones prevent breast milk from being released before your baby is born as well as prompt milk flow after birth. Once breastfeeding has begun, thoughts of your baby, as well as his or her sucking or crying, can stimulate the release of milk from your milk-producing glands. Benefits of breastfeeding For Your Baby  Your first milk (colostrum) helps your baby's digestive system function better.  There are antibodies in your milk that help your baby fight off infections.  Your baby has a lower incidence of asthma, allergies, and sudden infant death syndrome.  The nutrients in breast milk are better for your baby than infant formulas and are designed uniquely for your baby's needs.  Breast milk improves your baby's brain development.  Your baby is less likely to develop other conditions, such as childhood obesity, asthma, or type 2 diabetes mellitus. For You  Breastfeeding helps to create a very special bond between you and your baby.  Breastfeeding is convenient. Breast milk is always available at the correct temperature and costs nothing.  Breastfeeding helps to burn calories and helps you lose the weight gained during pregnancy.  Breastfeeding makes your uterus contract to its prepregnancy size faster and slows bleeding (lochia) after you give birth.  Breastfeeding helps to lower your risk of developing type 2 diabetes mellitus, osteoporosis, and breast or ovarian cancer later in life. Signs that your baby is hungry Early Signs of Hunger  Increased alertness or activity.  Stretching.  Movement of the head from side to side.  Movement of the head and opening of the mouth when the corner of the mouth or cheek is stroked (rooting).  Increased sucking sounds, smacking lips, cooing, sighing, or squeaking.  Hand-to-mouth movements.  Increased  sucking of fingers or hands. Late Signs of Hunger  Fussing.  Intermittent crying. Extreme Signs of Hunger  Signs of extreme hunger will require calming and consoling before your baby will   be able to breastfeed successfully. Do not wait for the following signs of extreme hunger to occur before you initiate breastfeeding:  Restlessness.  A loud, strong cry.  Screaming. Breastfeeding basics  Breastfeeding Initiation  Find a comfortable place to sit or lie down, with your neck and back well supported.  Place a pillow or rolled up blanket under your baby to bring him or her to the level of your breast (if you are seated). Nursing pillows are specially designed to help support your arms and your baby while you breastfeed.  Make sure that your baby's abdomen is facing your abdomen.  Gently massage your breast. With your fingertips, massage from your chest wall toward your nipple in a circular motion. This encourages milk flow. You may need to continue this action during the feeding if your milk flows slowly.  Support your breast with 4 fingers underneath and your thumb above your nipple. Make sure your fingers are well away from your nipple and your baby's mouth.  Stroke your baby's lips gently with your finger or nipple.  When your baby's mouth is open wide enough, quickly bring your baby to your breast, placing your entire nipple and as much of the colored area around your nipple (areola) as possible into your baby's mouth.  More areola should be visible above your baby's upper lip than below the lower lip.  Your baby's tongue should be between his or her lower gum and your breast.  Ensure that your baby's mouth is correctly positioned around your nipple (latched). Your baby's lips should create a seal on your breast and be turned out (everted).  It is common for your baby to suck about 2-3 minutes in order to start the flow of breast milk. Latching  Teaching your baby how to latch  on to your breast properly is very important. An improper latch can cause nipple pain and decreased milk supply for you and poor weight gain in your baby. Also, if your baby is not latched onto your nipple properly, he or she may swallow some air during feeding. This can make your baby fussy. Burping your baby when you switch breasts during the feeding can help to get rid of the air. However, teaching your baby to latch on properly is still the best way to prevent fussiness from swallowing air while breastfeeding. Signs that your baby has successfully latched on to your nipple:  Silent tugging or silent sucking, without causing you pain.  Swallowing heard between every 3-4 sucks.  Muscle movement above and in front of his or her ears while sucking. Signs that your baby has not successfully latched on to nipple:  Sucking sounds or smacking sounds from your baby while breastfeeding.  Nipple pain. If you think your baby has not latched on correctly, slip your finger into the corner of your baby's mouth to break the suction and place it between your baby's gums. Attempt breastfeeding initiation again. Signs of Successful Breastfeeding  Signs from your baby:  A gradual decrease in the number of sucks or complete cessation of sucking.  Falling asleep.  Relaxation of his or her body.  Retention of a small amount of milk in his or her mouth.  Letting go of your breast by himself or herself. Signs from you:  Breasts that have increased in firmness, weight, and size 1-3 hours after feeding.  Breasts that are softer immediately after breastfeeding.  Increased milk volume, as well as a change in milk consistency and color by   the fifth day of breastfeeding.  Nipples that are not sore, cracked, or bleeding. Signs That Your Baby is Getting Enough Milk  Wetting at least 1-2 diapers during the first 24 hours after birth.  Wetting at least 5-6 diapers every 24 hours for the first week after  birth. The urine should be clear or pale yellow by 5 days after birth.  Wetting 6-8 diapers every 24 hours as your baby continues to grow and develop.  At least 3 stools in a 24-hour period by age 5 days. The stool should be soft and yellow.  At least 3 stools in a 24-hour period by age 7 days. The stool should be seedy and yellow.  No loss of weight greater than 10% of birth weight during the first 3 days of age.  Average weight gain of 4-7 ounces (113-198 g) per week after age 4 days.  Consistent daily weight gain by age 5 days, without weight loss after the age of 2 weeks. After a feeding, your baby may spit up a small amount. This is common. Breastfeeding frequency and duration Frequent feeding will help you make more milk and can prevent sore nipples and breast engorgement. Breastfeed when you feel the need to reduce the fullness of your breasts or when your baby shows signs of hunger. This is called "breastfeeding on demand." Avoid introducing a pacifier to your baby while you are working to establish breastfeeding (the first 4-6 weeks after your baby is born). After this time you may choose to use a pacifier. Research has shown that pacifier use during the first year of a baby's life decreases the risk of sudden infant death syndrome (SIDS). Allow your baby to feed on each breast as long as he or she wants. Breastfeed until your baby is finished feeding. When your baby unlatches or falls asleep while feeding from the first breast, offer the second breast. Because newborns are often sleepy in the first few weeks of life, you may need to awaken your baby to get him or her to feed. Breastfeeding times will vary from baby to baby. However, the following rules can serve as a guide to help you ensure that your baby is properly fed:  Newborns (babies 4 weeks of age or younger) may breastfeed every 1-3 hours.  Newborns should not go longer than 3 hours during the day or 5 hours during the night  without breastfeeding.  You should breastfeed your baby a minimum of 8 times in a 24-hour period until you begin to introduce solid foods to your baby at around 6 months of age. Breast milk pumping Pumping and storing breast milk allows you to ensure that your baby is exclusively fed your breast milk, even at times when you are unable to breastfeed. This is especially important if you are going back to work while you are still breastfeeding or when you are not able to be present during feedings. Your lactation consultant can give you guidelines on how long it is safe to store breast milk. A breast pump is a machine that allows you to pump milk from your breast into a sterile bottle. The pumped breast milk can then be stored in a refrigerator or freezer. Some breast pumps are operated by hand, while others use electricity. Ask your lactation consultant which type will work best for you. Breast pumps can be purchased, but some hospitals and breastfeeding support groups lease breast pumps on a monthly basis. A lactation consultant can teach you how to   hand express breast milk, if you prefer not to use a pump. Caring for your breasts while you breastfeed Nipples can become dry, cracked, and sore while breastfeeding. The following recommendations can help keep your breasts moisturized and healthy:  Avoid using soap on your nipples.  Wear a supportive bra. Although not required, special nursing bras and tank tops are designed to allow access to your breasts for breastfeeding without taking off your entire bra or top. Avoid wearing underwire-style bras or extremely tight bras.  Air dry your nipples for 3-4minutes after each feeding.  Use only cotton bra pads to absorb leaked breast milk. Leaking of breast milk between feedings is normal.  Use lanolin on your nipples after breastfeeding. Lanolin helps to maintain your skin's normal moisture barrier. If you use pure lanolin, you do not need to wash it off  before feeding your baby again. Pure lanolin is not toxic to your baby. You may also hand express a few drops of breast milk and gently massage that milk into your nipples and allow the milk to air dry. In the first few weeks after giving birth, some women experience extremely full breasts (engorgement). Engorgement can make your breasts feel heavy, warm, and tender to the touch. Engorgement peaks within 3-5 days after you give birth. The following recommendations can help ease engorgement:  Completely empty your breasts while breastfeeding or pumping. You may want to start by applying warm, moist heat (in the shower or with warm water-soaked hand towels) just before feeding or pumping. This increases circulation and helps the milk flow. If your baby does not completely empty your breasts while breastfeeding, pump any extra milk after he or she is finished.  Wear a snug bra (nursing or regular) or tank top for 1-2 days to signal your body to slightly decrease milk production.  Apply ice packs to your breasts, unless this is too uncomfortable for you.  Make sure that your baby is latched on and positioned properly while breastfeeding. If engorgement persists after 48 hours of following these recommendations, contact your health care provider or a lactation consultant. Overall health care recommendations while breastfeeding  Eat healthy foods. Alternate between meals and snacks, eating 3 of each per day. Because what you eat affects your breast milk, some of the foods may make your baby more irritable than usual. Avoid eating these foods if you are sure that they are negatively affecting your baby.  Drink milk, fruit juice, and water to satisfy your thirst (about 10 glasses a day).  Rest often, relax, and continue to take your prenatal vitamins to prevent fatigue, stress, and anemia.  Continue breast self-awareness checks.  Avoid chewing and smoking tobacco. Chemicals from cigarettes that pass  into breast milk and exposure to secondhand smoke may harm your baby.  Avoid alcohol and drug use, including marijuana. Some medicines that may be harmful to your baby can pass through breast milk. It is important to ask your health care provider before taking any medicine, including all over-the-counter and prescription medicine as well as vitamin and herbal supplements. It is possible to become pregnant while breastfeeding. If birth control is desired, ask your health care provider about options that will be safe for your baby. Contact a health care provider if:  You feel like you want to stop breastfeeding or have become frustrated with breastfeeding.  You have painful breasts or nipples.  Your nipples are cracked or bleeding.  Your breasts are red, tender, or warm.  You have   a swollen area on either breast.  You have a fever or chills.  You have nausea or vomiting.  You have drainage other than breast milk from your nipples.  Your breasts do not become full before feedings by the fifth day after you give birth.  You feel sad and depressed.  Your baby is too sleepy to eat well.  Your baby is having trouble sleeping.  Your baby is wetting less than 3 diapers in a 24-hour period.  Your baby has less than 3 stools in a 24-hour period.  Your baby's skin or the white part of his or her eyes becomes yellow.  Your baby is not gaining weight by 5 days of age. Get help right away if:  Your baby is overly tired (lethargic) and does not want to wake up and feed.  Your baby develops an unexplained fever. This information is not intended to replace advice given to you by your health care provider. Make sure you discuss any questions you have with your health care provider. Document Released: 01/13/2005 Document Revised: 06/27/2015 Document Reviewed: 07/07/2012 Elsevier Interactive Patient Education  2017 Elsevier Inc.  

## 2015-12-27 LAB — GC/CHLAMYDIA PROBE AMP (~~LOC~~) NOT AT ARMC
CHLAMYDIA, DNA PROBE: NEGATIVE
Neisseria Gonorrhea: NEGATIVE

## 2015-12-29 LAB — CULTURE, BETA STREP (GROUP B ONLY)

## 2015-12-31 ENCOUNTER — Encounter (HOSPITAL_COMMUNITY): Payer: Self-pay

## 2015-12-31 ENCOUNTER — Inpatient Hospital Stay (HOSPITAL_COMMUNITY): Payer: Medicaid Other | Admitting: Anesthesiology

## 2015-12-31 ENCOUNTER — Encounter (HOSPITAL_COMMUNITY)
Admission: AD | Disposition: A | Discharge status: Home / Self Care | Payer: Self-pay | Source: Ambulatory Visit | Attending: Family Medicine

## 2015-12-31 ENCOUNTER — Inpatient Hospital Stay (HOSPITAL_COMMUNITY)
Admission: AD | Admit: 2015-12-31 | Discharge: 2016-01-03 | DRG: 766 | Disposition: A | Discharge status: Home / Self Care | Payer: Medicaid Other | Source: Ambulatory Visit | Attending: Family Medicine | Admitting: Family Medicine

## 2015-12-31 DIAGNOSIS — Z3A37 37 weeks gestation of pregnancy: Secondary | ICD-10-CM

## 2015-12-31 DIAGNOSIS — O99284 Endocrine, nutritional and metabolic diseases complicating childbirth: Secondary | ICD-10-CM | POA: Diagnosis present

## 2015-12-31 DIAGNOSIS — O0930 Supervision of pregnancy with insufficient antenatal care, unspecified trimester: Secondary | ICD-10-CM

## 2015-12-31 DIAGNOSIS — Z283 Underimmunization status: Secondary | ICD-10-CM

## 2015-12-31 DIAGNOSIS — O99280 Endocrine, nutritional and metabolic diseases complicating pregnancy, unspecified trimester: Secondary | ICD-10-CM

## 2015-12-31 DIAGNOSIS — Z2839 Other underimmunization status: Secondary | ICD-10-CM

## 2015-12-31 DIAGNOSIS — R609 Edema, unspecified: Secondary | ICD-10-CM

## 2015-12-31 DIAGNOSIS — O0990 Supervision of high risk pregnancy, unspecified, unspecified trimester: Secondary | ICD-10-CM

## 2015-12-31 DIAGNOSIS — Z98891 History of uterine scar from previous surgery: Secondary | ICD-10-CM

## 2015-12-31 DIAGNOSIS — O321XX0 Maternal care for breech presentation, not applicable or unspecified: Principal | ICD-10-CM | POA: Diagnosis present

## 2015-12-31 DIAGNOSIS — O4202 Full-term premature rupture of membranes, onset of labor within 24 hours of rupture: Secondary | ICD-10-CM

## 2015-12-31 DIAGNOSIS — O9989 Other specified diseases and conditions complicating pregnancy, childbirth and the puerperium: Secondary | ICD-10-CM

## 2015-12-31 DIAGNOSIS — O4292 Full-term premature rupture of membranes, unspecified as to length of time between rupture and onset of labor: Secondary | ICD-10-CM | POA: Diagnosis present

## 2015-12-31 DIAGNOSIS — E039 Hypothyroidism, unspecified: Secondary | ICD-10-CM | POA: Diagnosis present

## 2015-12-31 LAB — CBC
HEMATOCRIT: 28 % — AB (ref 36.0–46.0)
HEMOGLOBIN: 9.5 g/dL — AB (ref 12.0–15.0)
MCH: 30.7 pg (ref 26.0–34.0)
MCHC: 33.9 g/dL (ref 30.0–36.0)
MCV: 90.6 fL (ref 78.0–100.0)
Platelets: 287 10*3/uL (ref 150–400)
RBC: 3.09 MIL/uL — AB (ref 3.87–5.11)
RDW: 14 % (ref 11.5–15.5)
WBC: 7.6 10*3/uL (ref 4.0–10.5)

## 2015-12-31 LAB — TYPE AND SCREEN
ABO/RH(D): O POS
ANTIBODY SCREEN: NEGATIVE

## 2015-12-31 LAB — POCT FERN TEST: POCT FERN TEST: POSITIVE

## 2015-12-31 LAB — ABO/RH: ABO/RH(D): O POS

## 2015-12-31 SURGERY — Surgical Case
Anesthesia: Spinal | Site: Abdomen | Wound class: Clean Contaminated

## 2015-12-31 MED ORDER — ONDANSETRON HCL 4 MG/2ML IJ SOLN
INTRAMUSCULAR | Status: AC
  Filled 2015-12-31: qty 2

## 2015-12-31 MED ORDER — OXYTOCIN 40 UNITS IN LACTATED RINGERS INFUSION - SIMPLE MED: 2.5000 [IU]/h | INTRAVENOUS | Status: AC

## 2015-12-31 MED ORDER — ACETAMINOPHEN 325 MG PO TABS
650.0000 mg | ORAL_TABLET | ORAL | Status: DC | PRN
  Administered 2016-01-02 (×2): 650 mg via ORAL
  Filled 2015-12-31 (×2): qty 2

## 2015-12-31 MED ORDER — MENTHOL 3 MG MT LOZG: 1.0000 | LOZENGE | OROMUCOSAL | Status: DC | PRN

## 2015-12-31 MED ORDER — KETOROLAC TROMETHAMINE 30 MG/ML IJ SOLN: 30.0000 mg | Freq: Four times a day (QID) | INTRAMUSCULAR | Status: DC | PRN

## 2015-12-31 MED ORDER — KETOROLAC TROMETHAMINE 30 MG/ML IJ SOLN
INTRAMUSCULAR | Status: AC
  Administered 2015-12-31: 30 mg
  Filled 2015-12-31: qty 1

## 2015-12-31 MED ORDER — LACTATED RINGERS IV SOLN
INTRAVENOUS | Status: DC
  Administered 2016-01-01 (×3): via INTRAVENOUS

## 2015-12-31 MED ORDER — MEPERIDINE HCL 25 MG/ML IJ SOLN: 6.2500 mg | INTRAMUSCULAR | Status: DC | PRN

## 2015-12-31 MED ORDER — LACTATED RINGERS IV SOLN
INTRAVENOUS | Status: DC
  Administered 2015-12-31: 17:00:00 via INTRAVENOUS

## 2015-12-31 MED ORDER — IBUPROFEN 600 MG PO TABS: 600.0000 mg | ORAL_TABLET | Freq: Four times a day (QID) | ORAL | Status: DC | PRN

## 2015-12-31 MED ORDER — NALBUPHINE HCL 10 MG/ML IJ SOLN: 5.0000 mg | Freq: Once | INTRAMUSCULAR | Status: DC | PRN

## 2015-12-31 MED ORDER — MORPHINE SULFATE-NACL 0.5-0.9 MG/ML-% IV SOSY
PREFILLED_SYRINGE | INTRAVENOUS | Status: DC | PRN
  Administered 2015-12-31: .2 mg via INTRATHECAL

## 2015-12-31 MED ORDER — FENTANYL CITRATE (PF) 100 MCG/2ML IJ SOLN
INTRAMUSCULAR | Status: AC
  Filled 2015-12-31: qty 2

## 2015-12-31 MED ORDER — NALOXONE HCL 0.4 MG/ML IJ SOLN: 0.4000 mg | INTRAMUSCULAR | Status: DC | PRN

## 2015-12-31 MED ORDER — FENTANYL CITRATE (PF) 100 MCG/2ML IJ SOLN
INTRAMUSCULAR | Status: DC | PRN
  Administered 2015-12-31: 20 ug via INTRATHECAL

## 2015-12-31 MED ORDER — LEVOTHYROXINE SODIUM 125 MCG PO TABS
125.0000 ug | ORAL_TABLET | Freq: Every day | ORAL | Status: DC
  Administered 2016-01-01 – 2016-01-03 (×3): 125 ug via ORAL
  Filled 2015-12-31 (×3): qty 1

## 2015-12-31 MED ORDER — FAMOTIDINE IN NACL 20-0.9 MG/50ML-% IV SOLN
20.0000 mg | Freq: Once | INTRAVENOUS | Status: AC
  Administered 2015-12-31: 20 mg via INTRAVENOUS
  Filled 2015-12-31: qty 50

## 2015-12-31 MED ORDER — PHENYLEPHRINE 40 MCG/ML (10ML) SYRINGE FOR IV PUSH (FOR BLOOD PRESSURE SUPPORT)
PREFILLED_SYRINGE | INTRAVENOUS | Status: AC
  Filled 2015-12-31: qty 10

## 2015-12-31 MED ORDER — LACTATED RINGERS IV SOLN
INTRAVENOUS | Status: DC | PRN
  Administered 2015-12-31 (×3): via INTRAVENOUS

## 2015-12-31 MED ORDER — IBUPROFEN 600 MG PO TABS
600.0000 mg | ORAL_TABLET | Freq: Four times a day (QID) | ORAL | Status: DC
  Administered 2015-12-31 – 2016-01-03 (×11): 600 mg via ORAL
  Filled 2015-12-31 (×11): qty 1

## 2015-12-31 MED ORDER — ZOLPIDEM TARTRATE 5 MG PO TABS: 5.0000 mg | ORAL_TABLET | Freq: Every evening | ORAL | Status: DC | PRN

## 2015-12-31 MED ORDER — MORPHINE SULFATE-NACL 0.5-0.9 MG/ML-% IV SOSY
PREFILLED_SYRINGE | INTRAVENOUS | Status: AC
  Filled 2015-12-31: qty 1

## 2015-12-31 MED ORDER — COCONUT OIL OIL
1.0000 | TOPICAL_OIL | Status: DC | PRN
Start: 2015-12-31 — End: 2016-01-03

## 2015-12-31 MED ORDER — PRENATAL MULTIVITAMIN CH
1.0000 | ORAL_TABLET | Freq: Every day | ORAL | Status: DC
  Administered 2016-01-01 – 2016-01-03 (×3): 1 via ORAL
  Filled 2015-12-31 (×3): qty 1

## 2015-12-31 MED ORDER — OXYTOCIN 10 UNIT/ML IJ SOLN
INTRAMUSCULAR | Status: AC
Start: 2015-12-31 — End: 2015-12-31
  Filled 2015-12-31: qty 4

## 2015-12-31 MED ORDER — DIBUCAINE 1 % RE OINT: 1.0000 "application " | TOPICAL_OINTMENT | RECTAL | Status: DC | PRN

## 2015-12-31 MED ORDER — NALBUPHINE HCL 10 MG/ML IJ SOLN: 5.0000 mg | INTRAMUSCULAR | Status: DC | PRN

## 2015-12-31 MED ORDER — ONDANSETRON HCL 4 MG/2ML IJ SOLN
4.0000 mg | Freq: Three times a day (TID) | INTRAMUSCULAR | Status: DC | PRN
  Administered 2015-12-31: 4 mg via INTRAVENOUS
  Filled 2015-12-31: qty 2

## 2015-12-31 MED ORDER — TETANUS-DIPHTH-ACELL PERTUSSIS 5-2.5-18.5 LF-MCG/0.5 IM SUSP: 0.5000 mL | Freq: Once | INTRAMUSCULAR | Status: DC

## 2015-12-31 MED ORDER — ONDANSETRON HCL 4 MG/2ML IJ SOLN
INTRAMUSCULAR | Status: DC | PRN
  Administered 2015-12-31: 4 mg via INTRAVENOUS

## 2015-12-31 MED ORDER — LACTATED RINGERS IV BOLUS (SEPSIS)
1000.0000 mL | Freq: Once | INTRAVENOUS | Status: AC
  Administered 2015-12-31: 1000 mL via INTRAVENOUS

## 2015-12-31 MED ORDER — PROMETHAZINE HCL 25 MG/ML IJ SOLN: 6.2500 mg | INTRAMUSCULAR | Status: DC | PRN

## 2015-12-31 MED ORDER — SOD CITRATE-CITRIC ACID 500-334 MG/5ML PO SOLN
30.0000 mL | Freq: Once | ORAL | Status: AC
  Administered 2015-12-31: 30 mL via ORAL
  Filled 2015-12-31: qty 15

## 2015-12-31 MED ORDER — DIPHENHYDRAMINE HCL 50 MG/ML IJ SOLN: 12.5000 mg | INTRAMUSCULAR | Status: DC | PRN

## 2015-12-31 MED ORDER — DIPHENHYDRAMINE HCL 25 MG PO CAPS: 25.0000 mg | ORAL_CAPSULE | ORAL | Status: DC | PRN

## 2015-12-31 MED ORDER — ACETAMINOPHEN 500 MG PO TABS
1000.0000 mg | ORAL_TABLET | Freq: Four times a day (QID) | ORAL | Status: AC
  Administered 2015-12-31 – 2016-01-01 (×2): 1000 mg via ORAL
  Filled 2015-12-31 (×2): qty 2

## 2015-12-31 MED ORDER — LEVOTHYROXINE SODIUM 25 MCG PO TABS: 25.0000 ug | ORAL_TABLET | Freq: Every day | ORAL | Status: DC

## 2015-12-31 MED ORDER — SODIUM CHLORIDE 0.9% FLUSH: 3.0000 mL | INTRAVENOUS | Status: DC | PRN

## 2015-12-31 MED ORDER — BUPIVACAINE IN DEXTROSE 0.75-8.25 % IT SOLN
INTRATHECAL | Status: DC | PRN
  Administered 2015-12-31: 1.4 mL via INTRATHECAL

## 2015-12-31 MED ORDER — OXYCODONE-ACETAMINOPHEN 5-325 MG PO TABS: 1.0000 | ORAL_TABLET | ORAL | Status: DC | PRN

## 2015-12-31 MED ORDER — PHENYLEPHRINE 8 MG IN D5W 100 ML (0.08MG/ML) PREMIX OPTIME
INJECTION | INTRAVENOUS | Status: AC
  Filled 2015-12-31: qty 100

## 2015-12-31 MED ORDER — KETOROLAC TROMETHAMINE 30 MG/ML IJ SOLN: 30.0000 mg | Freq: Once | INTRAMUSCULAR | Status: DC

## 2015-12-31 MED ORDER — LACTATED RINGERS IV SOLN
INTRAVENOUS | Status: DC | PRN
  Administered 2015-12-31: 17:00:00 via INTRAVENOUS

## 2015-12-31 MED ORDER — CEFAZOLIN SODIUM-DEXTROSE 2-4 GM/100ML-% IV SOLN
2.0000 g | INTRAVENOUS | Status: AC
  Administered 2015-12-31: 2 g via INTRAVENOUS
  Filled 2015-12-31: qty 100

## 2015-12-31 MED ORDER — SIMETHICONE 80 MG PO CHEW
80.0000 mg | CHEWABLE_TABLET | ORAL | Status: DC
  Administered 2016-01-01 – 2016-01-02 (×2): 80 mg via ORAL
  Filled 2015-12-31 (×3): qty 1

## 2015-12-31 MED ORDER — SIMETHICONE 80 MG PO CHEW: 80.0000 mg | CHEWABLE_TABLET | ORAL | Status: DC | PRN

## 2015-12-31 MED ORDER — PHENYLEPHRINE 8 MG IN D5W 100 ML (0.08MG/ML) PREMIX OPTIME
INJECTION | INTRAVENOUS | Status: DC | PRN
  Administered 2015-12-31: 60 ug/min via INTRAVENOUS

## 2015-12-31 MED ORDER — OXYTOCIN 40 UNITS IN LACTATED RINGERS INFUSION - SIMPLE MED
INTRAVENOUS | Status: DC | PRN
  Administered 2015-12-31: 40 [IU] via INTRAVENOUS

## 2015-12-31 MED ORDER — SENNOSIDES-DOCUSATE SODIUM 8.6-50 MG PO TABS
2.0000 | ORAL_TABLET | ORAL | Status: DC
  Administered 2016-01-01 – 2016-01-02 (×2): 2 via ORAL
  Filled 2015-12-31 (×3): qty 2

## 2015-12-31 MED ORDER — SCOPOLAMINE 1 MG/3DAYS TD PT72
1.0000 | MEDICATED_PATCH | Freq: Once | TRANSDERMAL | Status: DC
  Administered 2015-12-31: 1.5 mg via TRANSDERMAL
  Filled 2015-12-31: qty 1

## 2015-12-31 MED ORDER — HYDROMORPHONE HCL 1 MG/ML IJ SOLN: 0.2500 mg | INTRAMUSCULAR | Status: DC | PRN

## 2015-12-31 MED ORDER — OXYCODONE-ACETAMINOPHEN 5-325 MG PO TABS
2.0000 | ORAL_TABLET | ORAL | Status: DC | PRN
  Administered 2016-01-02: 2 via ORAL
  Filled 2015-12-31: qty 2

## 2015-12-31 MED ORDER — WITCH HAZEL-GLYCERIN EX PADS: 1.0000 "application " | MEDICATED_PAD | CUTANEOUS | Status: DC | PRN

## 2015-12-31 MED ORDER — NALOXONE HCL 2 MG/2ML IJ SOSY: 1.0000 ug/kg/h | PREFILLED_SYRINGE | INTRAVENOUS | Status: DC | PRN

## 2015-12-31 MED ORDER — SIMETHICONE 80 MG PO CHEW
80.0000 mg | CHEWABLE_TABLET | Freq: Three times a day (TID) | ORAL | Status: DC
  Administered 2016-01-01 – 2016-01-03 (×6): 80 mg via ORAL
  Filled 2015-12-31 (×6): qty 1

## 2015-12-31 MED ORDER — DIPHENHYDRAMINE HCL 25 MG PO CAPS: 25.0000 mg | ORAL_CAPSULE | Freq: Four times a day (QID) | ORAL | Status: DC | PRN

## 2015-12-31 SURGICAL SUPPLY — 35 items
ADH SKN CLS APL DERMABOND .7 (GAUZE/BANDAGES/DRESSINGS) ×1
APL SKNCLS STERI-STRIP NONHPOA (GAUZE/BANDAGES/DRESSINGS) ×1
BENZOIN TINCTURE PRP APPL 2/3 (GAUZE/BANDAGES/DRESSINGS) ×2 IMPLANT
CANISTER SUCT 3000ML PPV (MISCELLANEOUS) ×3 IMPLANT
CHLORAPREP W/TINT 26ML (MISCELLANEOUS) ×3 IMPLANT
CLOSURE STERI STRIP 1/2 X4 (GAUZE/BANDAGES/DRESSINGS) ×2 IMPLANT
DERMABOND ADVANCED (GAUZE/BANDAGES/DRESSINGS) ×2
DERMABOND ADVANCED .7 DNX12 (GAUZE/BANDAGES/DRESSINGS) ×1 IMPLANT
DRSG OPSITE POSTOP 4X10 (GAUZE/BANDAGES/DRESSINGS) ×3 IMPLANT
ELECT REM PT RETURN 9FT ADLT (ELECTROSURGICAL) ×3
ELECTRODE REM PT RTRN 9FT ADLT (ELECTROSURGICAL) ×1 IMPLANT
GLOVE BIOGEL PI IND STRL 7.0 (GLOVE) ×2 IMPLANT
GLOVE BIOGEL PI IND STRL 7.5 (GLOVE) ×1 IMPLANT
GLOVE BIOGEL PI INDICATOR 7.0 (GLOVE) ×4
GLOVE BIOGEL PI INDICATOR 7.5 (GLOVE) ×2
GLOVE SKINSENSE NS SZ7.0 (GLOVE) ×2
GLOVE SKINSENSE STRL SZ7.0 (GLOVE) ×1 IMPLANT
GOWN STRL REUS W/ TWL LRG LVL3 (GOWN DISPOSABLE) ×2 IMPLANT
GOWN STRL REUS W/ TWL XL LVL3 (GOWN DISPOSABLE) ×1 IMPLANT
GOWN STRL REUS W/TWL LRG LVL3 (GOWN DISPOSABLE) ×6
GOWN STRL REUS W/TWL XL LVL3 (GOWN DISPOSABLE) ×3
NS IRRIG 1000ML POUR BTL (IV SOLUTION) ×3 IMPLANT
PACK C SECTION WH (CUSTOM PROCEDURE TRAY) ×3 IMPLANT
PAD ABD 7.5X8 STRL (GAUZE/BANDAGES/DRESSINGS) ×2 IMPLANT
PAD OB MATERNITY 4.3X12.25 (PERSONAL CARE ITEMS) ×3 IMPLANT
PAD PREP 24X48 CUFFED NSTRL (MISCELLANEOUS) ×3 IMPLANT
SPONGE GAUZE 4X4 12PLY (GAUZE/BANDAGES/DRESSINGS) ×2 IMPLANT
SUT MON AB 4-0 PS1 27 (SUTURE) ×3 IMPLANT
SUT MON AB-0 CT1 36 (SUTURE) ×6 IMPLANT
SUT PLAIN 2 0 (SUTURE) ×3
SUT PLAIN ABS 2-0 CT1 27XMFL (SUTURE) ×1 IMPLANT
SUT VIC AB 0 CT1 36 (SUTURE) ×6 IMPLANT
SUT VIC AB 3-0 CT1 27 (SUTURE) ×3
SUT VIC AB 3-0 CT1 TAPERPNT 27 (SUTURE) ×1 IMPLANT
YANKAUER SUCT BULB TIP NO VENT (SUCTIONS) ×2 IMPLANT

## 2015-12-31 NOTE — Anesthesia Preprocedure Evaluation (Signed)
Anesthesia Evaluation  Patient identified by MRN, date of birth, ID band Patient awake    Reviewed: Allergy & Precautions, H&P , NPO status , Patient's Chart, lab work & pertinent test results  Airway Mallampati: I  TM Distance: >3 FB Neck ROM: full    Dental no notable dental hx.    Pulmonary neg pulmonary ROS,    Pulmonary exam normal        Cardiovascular negative cardio ROS Normal cardiovascular exam     Neuro/Psych negative neurological ROS  negative psych ROS   GI/Hepatic negative GI ROS, Neg liver ROS,   Endo/Other    Renal/GU negative Renal ROS     Musculoskeletal   Abdominal (+) + obese,   Peds  Hematology negative hematology ROS (+)   Anesthesia Other Findings   Reproductive/Obstetrics (+) Pregnancy                             Anesthesia Physical Anesthesia Plan  ASA: II  Anesthesia Plan: Spinal   Post-op Pain Management:    Induction:   Airway Management Planned:   Additional Equipment:   Intra-op Plan:   Post-operative Plan:   Informed Consent: I have reviewed the patients History and Physical, chart, labs and discussed the procedure including the risks, benefits and alternatives for the proposed anesthesia with the patient or authorized representative who has indicated his/her understanding and acceptance.     Plan Discussed with: CRNA and Surgeon  Anesthesia Plan Comments:         Anesthesia Quick Evaluation

## 2015-12-31 NOTE — MAU Note (Signed)
Pt presents to MAU with possible ROM. Pt reports feeling a gush about ago of odorless "reddish clear" fluid. Reports she is still leaking. Denies any bleeding. Reports good fetal movement. Pt reports she is just starting to feel ctx now.

## 2015-12-31 NOTE — Consult Note (Signed)
Neonatology Note:   Attendance at C-section:    I was asked by Dr. Stinson to attend this primary C/S at term. The mother is a G1, GBS negative with good prenatal care. Pregnancy complicated by late prenatal care, hypothyroid.  ROM 4 hours before delivery, fluid clear. Infant vigorous with good spontaneous cry and tone. Needed only minimal bulb suctioning. Ap 8/9. Lungs clear to ausc in DR. To CN to care of Pediatrician.   Terri Moder C. Kelechi Orgeron, MD   

## 2015-12-31 NOTE — Op Note (Signed)
Terri Glenn PROCEDURE DATE: 12/31/2015  PREOPERATIVE DIAGNOSIS: Intrauterine pregnancy at  6136w6d weeks gestation; malpresentation: breech  POSTOPERATIVE DIAGNOSIS: The same  PROCEDURE: Primary Low Transverse Cesarean Section  SURGEON:  Dr. Candelaria CelesteJacob Curtez Brallier  ASSISTANT: Dr Deforest HoylesNoah Wallace, PGY3  INDICATIONS: Terri Glenn is a 20 y.o. G1P1001 at 5636w6d scheduled for cesarean section secondary to malpresentation: breech.  The risks of cesarean section discussed with the patient included but were not limited to: bleeding which may require transfusion or reoperation; infection which may require antibiotics; injury to bowel, bladder, ureters or other surrounding organs; injury to the fetus; need for additional procedures including hysterectomy in the event of a life-threatening hemorrhage; placental abnormalities wth subsequent pregnancies, incisional problems, thromboembolic phenomenon and other postoperative/anesthesia complications. The patient concurred with the proposed plan, giving informed written consent for the procedure.    FINDINGS:  Viable female infant in frank breech presentation.  Apgars 8 and 9, weight pending.  Clear amniotic fluid.  Intact placenta, three vessel cord.  Normal uterus, fallopian tubes and ovaries bilaterally.  ANESTHESIA:    Spinal INTRAVENOUS FLUIDS:2800 ml ESTIMATED BLOOD LOSS: 275 ml URINE OUTPUT:  600 ml SPECIMENS: Placenta sent to L&D COMPLICATIONS: None immediate  PROCEDURE IN DETAIL:  The patient received intravenous antibiotics and had sequential compression devices applied to her lower extremities while in the preoperative area.  She was then taken to the operating room where spinal anesthesia was administered and was found to be adequate. She was then placed in a dorsal supine position with a leftward tilt, and prepped and draped in a sterile manner.  A foley catheter was placed into her bladder and attached to constant gravity, which drained clear  fluid throughout.  After an adequate timeout was performed, a Pfannenstiel skin incision was made with scalpel and carried through to the underlying layer of fascia. The fascia was incised in the midline and this incision was extended bilaterally using the Mayo scissors. Kocher clamps were applied to the superior aspect of the fascial incision and the underlying rectus muscles were dissected off bluntly. The rectus muscles were separated in the midline bluntly and the peritoneum was entered bluntly. An Alexis retractor was placed to aid in visualization of the uterus.  Attention was turned to the lower uterine segment where a transverse hysterotomy was made with a scalpel and extended bilaterally bluntly. The infant was successfully delivered, and cord was clamped and cut and infant was handed over to awaiting neonatology team. Uterine massage was then administered and the placenta delivered intact with three-vessel cord. The uterus was then cleared of clot and debris.  The hysterotomy was closed with 0 Vicryl in a running locked fashion, and an imbricating layer was also placed with a 0 Vicryl. Overall, excellent hemostasis was noted. The abdomen and the pelvis were cleared of all clot and debris and the Jon Gillslexis was removed. Hemostasis was confirmed on all surfaces.  The peritoneum was reapproximated using 2-0 vicryl running stitches. The fascia was then closed using 0 Vicryl in a running fashion. Tthe skin was closed with 4-0 vicryl. The patient tolerated the procedure well. Sponge, lap, instrument and needle counts were correct x 2. She was taken to the recovery room in stable condition.    Terri HeritageJacob J Ericberto Padget, DO 12/31/2015 7:40 PM

## 2015-12-31 NOTE — Transfer of Care (Signed)
Immediate Anesthesia Transfer of Care Note  Patient: Terri Glenn  Procedure(s) Performed: Procedure(s): CESAREAN SECTION (N/A)  Patient Location: PACU  Anesthesia Type:Spinal  Level of Consciousness: awake, alert  and oriented  Airway & Oxygen Therapy: Patient Spontanous Breathing  Post-op Assessment: Report given to RN and Post -op Vital signs reviewed and stable  Post vital signs: Reviewed and stable  Last Vitals:  Vitals:   12/31/15 1759 12/31/15 1800  BP: 111/60 99/62  Pulse: 78 74  Resp:  13  Temp:      Last Pain: There were no vitals filed for this visit.       Complications: No apparent anesthesia complications

## 2015-12-31 NOTE — Progress Notes (Signed)
Patient ambulated out of bed with minimal to no assistance. Walked to the chair. Orthostatics completed, Tolerated well. No dizziness/nausea/vomitting.

## 2015-12-31 NOTE — MAU Note (Signed)
OR co-ordinator, circulator, anes,house coverage, central nursery notified - primary c/s, ROM and breech presentation

## 2015-12-31 NOTE — H&P (Signed)
LABOR AND DELIVERY ADMISSION HISTORY AND PHYSICAL NOTE  Terri Glenn is a 20 y.o. female G1P0000 with IUP at 8162w6d by LMP presenting for SROM.   She reports positive fetal movement. She denies leakage of fluid or vaginal bleeding.  Prenatal History/Complications:  Past Medical History: Past Medical History:  Diagnosis Date  . Hypothyroidism   . Thyroid disease     Past Surgical History: Past Surgical History:  Procedure Laterality Date  . NO PAST SURGERIES      Obstetrical History: OB History    Gravida Para Term Preterm AB Living   1 0 0 0 0 0   SAB TAB Ectopic Multiple Live Births   0 0 0 0 0      Social History: Social History   Social History  . Marital status: Single    Spouse name: N/A  . Number of children: N/A  . Years of education: N/A   Social History Main Topics  . Smoking status: Never Smoker  . Smokeless tobacco: Never Used  . Alcohol use No  . Drug use: No  . Sexual activity: Yes   Other Topics Concern  . None   Social History Narrative  . None    Family History: Family History  Problem Relation Age of Onset  . Asthma Brother     Allergies: No Known Allergies  Prescriptions Prior to Admission  Medication Sig Dispense Refill Last Dose  . levothyroxine (SYNTHROID) 100 MCG tablet Take 1 tablet (100 mcg total) by mouth daily before breakfast. (Patient taking differently: Take 100 mcg by mouth daily before breakfast. Patient takes 25mcg with 100mcg for a total of 125mcg) 30 tablet 1 12/31/2015 at Unknown time  . levothyroxine (SYNTHROID, LEVOTHROID) 25 MCG tablet Combine with your 100mcg tablet to take 125mcg qday total on an empty stomach. (Patient taking differently: Take 25 mcg by mouth daily before breakfast. Patient takes 25mcg with 100mcg for a total of 125mcg) 60 tablet 1 12/31/2015 at Unknown time  . Prenatal Vit-Fe Fumarate-FA (PRENATAL MULTIVITAMIN) TABS tablet Take 1 tablet by mouth daily at 12 noon.   Past Month at Unknown  time     Review of Systems   All systems reviewed and negative except as stated in HPI  Blood pressure 122/79, pulse 76, temperature 97.6 F (36.4 C), resp. rate 18. General appearance: alert, cooperative and appears stated age Lungs: clear to auscultation bilaterally Heart: regular rate and rhythm Abdomen: soft, non-tender; bowel sounds normal Extremities: No calf swelling or tenderness Presentation: cephalic Fetal monitoring: Cat I tracing Uterine activity: q4-815min     Prenatal labs: ABO, Rh: O/POS/-- (09/21 1335) Antibody: NEG (09/21 1335) Rubella: !Error! RPR: NON REAC (09/21 1335)  HBsAg: NEGATIVE (09/21 1335)  HIV: NONREACTIVE (09/21 1335)  GBS:    1 hr Glucola: 3rd trimester normal Genetic screening:   Anatomy US: normal  Prenatal Transfer Tool  Maternal Diabetes: No Genetic Screening: Declined Maternal Ultrasounds/Referrals: Normal Fetal Ultrasounds or other Referrals:  None Maternal Substance Abuse:  No Significant Maternal Medications:  Meds include: Syntroid Significant Maternal Lab Results: None  Results for orders placed or performed during the hospital encounter of 12/31/15 (from the past 24 hour(s))  Fern Test   Collection Time: 12/31/15  3:02 PM  Result Value Ref Range   POCT Fern Test Positive = ruptured amniotic membanes     Patient Active Problem List   Diagnosis Date Noted  . Breech presentation with antenatal problem 12/26/2015  . Malpresentation before onset of labor 11/26/2015  .  Rubella non-immune status, antepartum 11/06/2015  . Supervision of high risk pregnancy, antepartum 10/18/2015  . Hypothyroidism affecting pregnancy, antepartum 10/18/2015  . Dependent edema 10/18/2015  . Late prenatal care affecting pregnancy, antepartum 10/18/2015    Assessment: Terri Glenn is a 20 y.o. G1P0000 at 5246w6d here for SROM  #Labor:Anticipate PLTCS for frank breech presentation and reduced success rate for ECV due to SROM #Pain: IV pain  meds prn/Epidural on request #FWB: Cat I tracing #ID:  GBS negative #MOF: Breast and Bottle #MOC:Mirena IUD #Circ:  N/a #hypothyroidism: off synthroid for 1 month  Nivedita Mirabella I, DO PGY-3 12/31/2015, 3:25 PM

## 2015-12-31 NOTE — Anesthesia Procedure Notes (Signed)
Spinal  Patient location during procedure: OR Start time: 12/31/2015 4:47 PM End time: 12/31/2015 4:50 PM Staffing Anesthesiologist: Leilani AbleHATCHETT, Akoni Parton Performed: anesthesiologist  Preanesthetic Checklist Completed: patient identified, surgical consent, pre-op evaluation, timeout performed, IV checked, risks and benefits discussed and monitors and equipment checked Spinal Block Patient position: sitting Prep: site prepped and draped and DuraPrep Patient monitoring: heart rate, cardiac monitor, continuous pulse ox and blood pressure Approach: midline Location: L3-4 Injection technique: single-shot Needle Needle type: Sprotte  Needle gauge: 24 G Needle length: 9 cm Needle insertion depth: 6 cm Assessment Sensory level: T4

## 2016-01-01 LAB — CBC WITH DIFFERENTIAL/PLATELET
BASOS ABS: 0 10*3/uL (ref 0.0–0.1)
Basophils Relative: 0 %
Eosinophils Absolute: 0.2 10*3/uL (ref 0.0–0.7)
Eosinophils Relative: 2 %
HEMATOCRIT: 25.1 % — AB (ref 36.0–46.0)
Hemoglobin: 8.6 g/dL — ABNORMAL LOW (ref 12.0–15.0)
LYMPHS ABS: 1.8 10*3/uL (ref 0.7–4.0)
LYMPHS PCT: 25 %
MCH: 31.3 pg (ref 26.0–34.0)
MCHC: 34.3 g/dL (ref 30.0–36.0)
MCV: 91.3 fL (ref 78.0–100.0)
MONO ABS: 0.2 10*3/uL (ref 0.1–1.0)
MONOS PCT: 3 %
NEUTROS ABS: 5 10*3/uL (ref 1.7–7.7)
Neutrophils Relative %: 70 %
Platelets: 238 10*3/uL (ref 150–400)
RBC: 2.75 MIL/uL — ABNORMAL LOW (ref 3.87–5.11)
RDW: 14.1 % (ref 11.5–15.5)
WBC: 7.2 10*3/uL (ref 4.0–10.5)

## 2016-01-01 LAB — CBC
HEMATOCRIT: 26.8 % — AB (ref 36.0–46.0)
HEMOGLOBIN: 9 g/dL — AB (ref 12.0–15.0)
MCH: 30.5 pg (ref 26.0–34.0)
MCHC: 33.6 g/dL (ref 30.0–36.0)
MCV: 90.8 fL (ref 78.0–100.0)
PLATELETS: 252 10*3/uL (ref 150–400)
RBC: 2.95 MIL/uL — AB (ref 3.87–5.11)
RDW: 13.9 % (ref 11.5–15.5)
WBC: 9 10*3/uL (ref 4.0–10.5)

## 2016-01-01 LAB — RPR: RPR Ser Ql: NONREACTIVE

## 2016-01-01 NOTE — Lactation Note (Addendum)
This note was copied from a baby's chart. Lactation Consultation Note Mom speaks good english, denies need of interpreter. Wants information in [place]. New mom is breast/formula. Mom had given 40ml formula [facility]. Mom states baby will not take her breast. Educated nipple confusion, supplementing, reviewed amounts, supply and demand, offer breast first.  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 Mom stated no breast change during pregnancy. Hand expressed easy flow of colostrum. Mom states she has leaked some.  Assisted in football hold. Discussed body alignment and support. Encouraged for mom to sand which breast in direction of baby's mouth. Put cheeks to breast. Baby fussy and pushing back from breast, yet aggressive at breast. Massage breast for colostrum release. Baby finally latched and BF well. Had one other latch difficulty. Discussed nipple confusion again. Noted filling of ducts in breast. Massaged, hand expressed 3ml colostrum. Encouraged to give that before formula. Mom encouraged to feed baby 8-12 times/24 hours and with feeding cues. Referred to Baby and Me Book in Breastfeeding section Pg. 22-23 for position options and Proper latch demonstration. Educated about newborn behavior, I&O, STS, & cluster feeding. WH/LC brochure given w/resources, support groups and LC services. Patient Name: Terri Myrene BuddyCynthia Glenn ZOXWR'UToday's Date: 01/01/2016 Reason for consult: Initial assessment   Maternal Data Has patient been taught Hand Expression?: Yes Does the patient have breastfeeding experience prior to this delivery?: No  Feeding Feeding Type: Formula Nipple Type: Slow - flow Length of feed: 15 min  LATCH Score/Interventions Latch: Repeated attempts needed to sustain latch, nipple held in mouth throughout feeding, stimulation needed to elicit sucking reflex. Intervention(s): Adjust position;Assist with latch;Breast massage;Breast  compression  Audible Swallowing: Spontaneous and intermittent  Type of Nipple: Everted at rest and after stimulation  Comfort (Breast/Nipple): Soft / non-tender     Hold (Positioning): Assistance needed to correctly position infant at breast and maintain latch. Intervention(s): Breastfeeding basics reviewed;Support Pillows;Position options;Skin to skin  LATCH Score: 8  Lactation Tools Discussed/Used WIC Program: Yes Pump Review: Setup, frequency, and cleaning;Milk Storage Initiated by:: Peri JeffersonL, Nakema Fake RN IBCLC Date initiated:: 01/01/16   Consult Status Consult Status: Follow-up Date: 01/02/16 Follow-up type: In-patient    Charyl DancerCARVER, Hristopher Missildine G 01/01/2016, 6:57 AM

## 2016-01-01 NOTE — Addendum Note (Signed)
Addendum  created 01/01/16 [id number] by Junious SilkMelinda Jeramine Delis, CRNA   Sign clinical note

## 2016-01-01 NOTE — Progress Notes (Signed)
At 0830 during the four-hour post-op check of patient, patient had a blood pressure of 96/47 and her honeycomb dressing was saturated. I called the teaching service phone, and was answered by resident Dr. Chales Abrahamsyson. The doctors were in a code cesarean and unable to put in orders at the moment. I recontacted them about forty-five minutes later and obtained orders from Dr. Macon LargeAnyanwu.   Dr. Earlene PlaterWallace also came in and saw the patient and ordered a new pressure dressing and follow-up CBC draw and asked us to recheck the blood pressure in an hour, measure the blood loss in the honeycomb dressing, which we measured as 2 mls. The repeat blood pressure was 99/50, which we will continue to monitor.

## 2016-01-01 NOTE — Progress Notes (Signed)
POSTPARTUM PROGRESS NOTE  Post Partum Day/ Post Operative Day 1 Subjective:  Myrene BuddyCynthia Cruz-Medina is a 20 y.o. G1P1001 7169w6d s/p PLTCS d/t SROM and Frank Breech presentation.  No acute events overnight.  Pt denies problems with ambulating, voiding or po intake.  She denies nausea or vomiting.  Pain is well controlled.  She has not had flatus. She has not had bowel movement.  Lochia Minimal.   Objective: Blood pressure 117/69, pulse 69, temperature 98.1 F (36.7 C), resp. rate 18, SpO2 98 %, unknown if currently breastfeeding.  Physical Exam:  General: alert, cooperative and no distress Lochia:normal flow Chest: CTAB Heart: RRR no m/r/g Abdomen: +BS, soft, nontender,  Uterine Fundus: firm, intact DVT Evaluation: No calf swelling or tenderness Incision: clean,dry, intact with honey comb bandage Extremities: no edema   Recent Labs  12/31/15 1538 01/01/16 0536  HGB 9.5* 9.0*  HCT 28.0* 26.8*    Assessment/Plan:  ASSESSMENT: Myrene BuddyCynthia Cruz-Medina is a 20 y.o. G1P1001 1469w6d s/p PTLCS  Plan for discharge tomorrow, Breastfeeding, Lactation consult and Contraception Mirena IUD   LOS: 1 day   Berniece SalinesWALLACE, NOAH I, DO PGY-3 [facility], [facility]  01/01/2016, 7:20 AM   CNM attestation Post Partum Day #1 I have seen and examined this patient and agree with above documentation in the resident's note.   Myrene BuddyCynthia Cruz-Medina is a 20 y.o. G1P1001 s/p pLTCS for SROM and breech.  Pt denies problems with ambulating, voiding or po intake. Pain is well controlled.  Plan for birth control is IUD.  Method of Feeding: breast  PE:  BP (!) 96/47   Pulse 62   Temp 98.6 F (37 C)   Resp 18   SpO2 98%   Breastfeeding? Unknown  Fundus firm  Plan for discharge: 01/02/16  Cam HaiSHAW, Mikeal Winstanley, CNM 9:12 AM  01/01/2016

## 2016-01-01 NOTE — Progress Notes (Signed)
RN notified me that pt honeycomb dressing saturated with a mixture of dark and light red blood. I went to examine the patient. Patient was otherwise comfortable.denied any fatigue,dizzines, lightheadedness, confusion,headache,n/v. Pt did have decreased BP in 90s/60s. Pt has had prior baseline BP in this range. She has maintenance IV fluids running at 125 cc/hr. Her incision was clean, dry and intact with no evidence of oozing, weepage, or serosanginous drainage. Will repeat a CBC given Hgb of 9.0 this am.   -Sheria LangNoah Aarib Pulido,DO PGY-3

## 2016-01-01 NOTE — Anesthesia Postprocedure Evaluation (Signed)
Anesthesia Post Note  Patient: Terri Glenn  Procedure(s) Performed: Procedure(s) (LRB): CESAREAN SECTION (N/A)  Patient location during evaluation: Mother Baby Anesthesia Type: Spinal Level of consciousness: oriented and awake and alert Pain management: pain level controlled Vital Signs Assessment: post-procedure vital signs reviewed and stable Respiratory status: spontaneous breathing and respiratory function stable Cardiovascular status: blood pressure returned to baseline and stable Postop Assessment: no headache and no backache Anesthetic complications: no     Last Vitals:  Vitals:   12/31/15 2307 01/01/16 0350  BP: 114/71 117/69  Pulse: 68 69  Resp: 18 18  Temp: 36.7 C 36.7 C    Last Pain:  Vitals:   01/01/16 0350  TempSrc:   PainSc: 3    Pain Goal:                 Junious SilkGILBERT,Zeriyah Wain

## 2016-01-01 NOTE — Anesthesia Postprocedure Evaluation (Signed)
Anesthesia Post Note  Patient: Aram BeechamCynthia Cruz-Medina  Procedure(s) Performed: Procedure(s) (LRB): CESAREAN SECTION (N/A)  Patient location during evaluation: PACU Anesthesia Type: Spinal Level of consciousness: awake Pain management: satisfactory to patient Vital Signs Assessment: post-procedure vital signs reviewed and stable Respiratory status: spontaneous breathing Cardiovascular status: blood pressure returned to baseline Postop Assessment: no headache and spinal receding Anesthetic complications: no     Last Vitals:  Vitals:   12/31/15 2307 01/01/16 0350  BP: 114/71 117/69  Pulse: 68 69  Resp: 18 18  Temp: 36.7 C 36.7 C    Last Pain:  Vitals:   01/01/16 0350  TempSrc:   PainSc: 3    Pain Goal:                 Jiles GarterJACKSON,Keymon Mcelroy EDWARD

## 2016-01-01 NOTE — Progress Notes (Signed)
UR chart review completed.  

## 2016-01-02 NOTE — Progress Notes (Signed)
Mother encouraged to walk halls, and use incentive spirometer. Mother encouraged to latch infant before feeding formula. Mother feeding more formula than breastfeeding.

## 2016-01-02 NOTE — Progress Notes (Addendum)
Patient ID: Terri Glenn, female   DOB: 02-17-1995, 20 y.o.   MRN: [id number]  POSTOPERATIVE PROGRESS NOTE  Post Op Day 2 Subjective:  Terri Glenn is a 20 y.o. G1P1001 1780w6d s/p PLTCS.  No acute events overnight.  Pt denies problems with ambulating, voiding or po intake.  She denies nausea or vomiting.  Pain is moderately controlled.  She has had flatus. She has not had bowel movement.  Lochia Minimal.  Does not report much bleeding from operative site.  Honeycomb dressing c/d/i.   Objective: Blood pressure (!) 102/55, pulse (!) 54, temperature 97.9 F (36.6 C), resp. rate 18, SpO2 97 %, unknown if currently breastfeeding.  Physical Exam:  General: alert, cooperative and no distress Lochia:normal flow Chest: CTAB Heart: RRR no m/r/g Abdomen: +BS, soft, nontender, dressing c/d/i.  Uterine Fundus: firm DVT Evaluation: No calf swelling or tenderness Extremities: no edema noted   Recent Labs  01/01/16 0536 01/01/16 1357  HGB 9.0* 8.6*  HCT 26.8* 25.1*    Assessment/Plan:  ASSESSMENT: Terri Glenn is a 20 y.o. G1P1001 5980w6d s/p PLTCS  Plan for discharge tomorrow   LOS: 2 days   Freddrick MarchYashika Gustin Zobrist, MD PGY-1 Center for [facility], [facility]  01/02/2016, 6:52 AM

## 2016-01-02 NOTE — Lactation Note (Signed)
This note was copied from a baby's chart. Lactation Consultation Note  Patient Name: Terri Glenn ZOXWR'UToday's Date: 01/02/2016 Reason for consult: Follow-up assessment   Follow up consult with mom of 46 hour old infant. Infant with 8 formula feeds via bottle of 10-38 cc, 4 voids and 3 stools in 24 hours preceding this assessment. Infant weight 7 lb 2.5 oz with 4% weight loss since birth. Infant has not been latching to breast.   Mom reports she did not want assistance with latching infant. She did want to start pumping. Mom is noted to have bilateral hypoplastic wide spaced breasts with right breast smaller and tubular in shape with everted nipples. Mom denies breast changes with pregnancy and has a history of Hypothyroidism. Mom denies changes in breasts since delivery.   Set up DEBP for mom with instructions for use, set up, assembling, disassembling and cleaning pump parts. Enc mom to pump every 3 hours on Initiate setting for 15 minutes post BF and to offer infant EBM in bottle prior to offering formula. Mom voiced understanding and began pumping.  Report to Cain SaupeMartha Stringer, RN. Follow up prn.     Maternal Data Formula Feeding for Exclusion: No Has patient been taught Hand Expression?: Yes  Feeding Feeding Type: Formula Nipple Type: Slow - flow  LATCH Score/Interventions                      Lactation Tools Discussed/Used WIC Program: Yes Pump Review: Setup, frequency, and cleaning Initiated by:: Noralee StainSharon Aleen Marston, RN, IBCLC Date initiated:: 01/02/16   Consult Status Consult Status: Follow-up Date: 01/03/16 Follow-up type: In-patient    Terri Glenn 01/02/2016, 3:51 PM

## 2016-01-03 ENCOUNTER — Encounter: Payer: Medicaid Other | Admitting: Family

## 2016-01-03 MED ORDER — DOCUSATE SODIUM 100 MG PO CAPS: 100.0000 mg | ORAL_CAPSULE | Freq: Two times a day (BID) | ORAL | 0 refills | Status: DC

## 2016-01-03 MED ORDER — MEASLES, MUMPS & RUBELLA VAC ~~LOC~~ INJ
0.5000 mL | INJECTION | Freq: Once | SUBCUTANEOUS | Status: AC
  Administered 2016-01-03: 0.5 mL via SUBCUTANEOUS
  Filled 2016-01-03: qty 0.5

## 2016-01-03 MED ORDER — OXYCODONE HCL 5 MG PO TABS: 5.0000 mg | ORAL_TABLET | ORAL | 0 refills | Status: DC | PRN

## 2016-01-03 MED ORDER — LEVOTHYROXINE SODIUM 125 MCG PO TABS: 125.0000 ug | ORAL_TABLET | Freq: Every day | ORAL | 0 refills | Status: DC

## 2016-01-03 MED ORDER — ACETAMINOPHEN 325 MG PO TABS: 650.0000 mg | ORAL_TABLET | ORAL | 0 refills | Status: DC | PRN

## 2016-01-03 NOTE — Discharge Summary (Signed)
OB Discharge Summary     Patient Name: Terri Glenn DOB: 10-04-1995 MRN: [id number]  Date of admission: 12/31/2015 Delivering MD: Levie HeritageSTINSON, JACOB J   Date of discharge: 01/03/2016  Admitting diagnosis: 38WKS,WATER BROKE Intrauterine pregnancy: 374w6d     Secondary diagnosis:  Active Problems:   Status post cesarean delivery  Additional problems: Hypothyroidism-on synthroid 125mcg po daily     Discharge diagnosis: Term Pregnancy Delivered                                                                                                Post partum procedures:none  Augmentation: none  Complications: None  Hospital course:  Onset of Labor With Unplanned C/S  20 y.o. yo G1P1001 at 624w6d was admitted in Latent Labor on 12/31/2015. Patient had a labor course significant for unplanned c-section. Membrane Rupture Time/Date: 2:00 PM ,12/31/2015   The patient [facility], and delivered a Viable infant,12/31/2015  Details of operation can be found in separate operative note. Patient had an uncomplicated postpartum course.  She is ambulating,tolerating a regular diet, passing flatus, and urinating well.  Patient is discharged home in stable condition 01/03/16.   Physical exam Vitals:   01/01/16 1729 01/02/16 0524 01/02/16 1915 01/03/16 0500  BP:  (!) 102/55 105/83 (!) 93/57  Pulse: (!) 57 (!) 54 77 (!) 57  Resp:  18 20 18   Temp:  97.9 F (36.6 C) 98.3 F (36.8 C) 98.5 F (36.9 C)  TempSrc: Oral  Oral Oral  SpO2:       General: alert, cooperative and no distress Lochia: appropriate Uterine Fundus: firm Incision: Healing well with no significant drainage, No significant erythema, Dressing is clean, dry, and intact DVT Evaluation: No evidence of DVT seen on physical exam. Negative Homan's sign. No cords or calf tenderness. No significant calf/ankle edema. Labs: Lab Results  Component Value Date   WBC 7.2 01/01/2016   HGB 8.6 (L)  01/01/2016   HCT 25.1 (L) 01/01/2016   MCV 91.3 01/01/2016   PLT 238 01/01/2016   No flowsheet data found.  Discharge instruction: per After Visit Summary and "Baby and Me Booklet".  After visit meds:    Medication List    STOP taking these medications   prenatal multivitamin Tabs tablet     TAKE these medications   acetaminophen 325 MG tablet Commonly known as:  TYLENOL Take 2 tablets (650 mg total) by mouth every 4 (four) hours as needed (for pain scale < 4).   levothyroxine 125 MCG tablet Commonly known as:  SYNTHROID, LEVOTHROID Take 1 tablet (125 mcg total) by mouth daily before breakfast. What changed:  medication strength  how much to take  Another medication with the same name was removed. Continue taking this medication, and follow the directions you see here.   oxyCODONE 5 MG immediate release tablet Commonly known as:  Oxy IR/ROXICODONE Take 1 tablet (5 mg total) by mouth every 4 (four) hours as needed for severe pain.       Diet: routine diet  Activity: Advance as tolerated. Pelvic rest  for 6 weeks.   Outpatient follow up:2 weeks Follow up Appt:No future appointments. Follow up Visit:No Follow-up on file.  Postpartum contraception: IUD Mirena  Newborn Data: Live born female  Birth Weight: 7 lb 6.9 oz (3370 g) APGAR: 8, 9  Baby Feeding: Bottle and Breast Disposition:home with mother   01/03/2016 Deforest HoylesWALLACE, NOAH I, DO PGY-3   OB FELLOW DISCHARGE ATTESTATION  I have seen and examined this patient and agree with above documentation in the resident's note.   Jen MowElizabeth Mumaw, DO OB Fellow 8:55 AM

## 2016-01-03 NOTE — Lactation Note (Signed)
This note was copied from a baby's chart. Lactation Consultation Note  Mother reports that she has been pumping every 3 hours. She is also hand expressing and has yielded up to 5 ml. Reminded mom that any all BM is beneficial.  Encouraged her to continue pumping and hand expressing. Baby is put to the breast on occasion.  Discussed using an SNS but she declined.  [facility] and plans to obtain a pump from them tomorrow. Explained how to use the piston as a double and single pump.  Aware of support groups and outpatient services. Patient Name: Terri Glenn ZOXWR'UToday's Date: 01/03/2016 Reason for consult: Follow-up assessment   Maternal Data    Feeding Nipple Type: Slow - flow  LATCH Score/Interventions                      Lactation Tools Discussed/Used     Consult Status Consult Status: Complete    Soyla DryerJoseph, Everson Mott 01/03/2016, 11:06 AM

## 2016-01-03 NOTE — Discharge Instructions (Signed)
Parto por cesárea, cuidados posteriores °(Cesarean Delivery, Care After) °Siga estas instrucciones durante las próximas semanas. Estas indicaciones le proporcionan información acerca de cómo deberá cuidarse después del procedimiento. El médico también podrá darle instrucciones más específicas. El tratamiento ha sido planificado según las prácticas médicas actuales, pero en algunos casos pueden ocurrir problemas. Comuníquese con el médico si tiene algún problema o dudas después del procedimiento. °QUÉ ESPERAR DESPUÉS DEL PROCEDIMIENTO °Después del procedimiento, es común tener los siguientes síntomas: °· Una pequeña cantidad de sangre o un líquido transparente que sale de la incisión. °· Tiene enrojecimiento, hinchazón o dolor en la zona de la incisión. °· Dolores y molestias abdominales. °· Hemorragia vaginal (loquios). °· Calambres pélvicos. °· Fatiga. °INSTRUCCIONES PARA EL CUIDADO EN EL HOGAR °Cuidado de la incisión °· Siga las indicaciones del médico acerca del cuidado de la incisión. Haga lo siguiente: °? Lávese las manos con agua y jabón antes de cambiar las vendas (vendaje). Use desinfectante para manos si no dispone de agua y jabón. °? Cambie el vendaje como se lo haya indicado el médico. °? No retire los puntos (suturas), las grapas cutáneas, el pegamento para la piel o las tiras adhesivas. Es posible que estos deban quedar puestos en la piel durante 2 semanas o más tiempo. Si los bordes de las tiras adhesivas empiezan a despegarse y enroscarse, puede recortar los que están sueltos. No retire las tiras adhesivas por completo a menos que el médico se lo indique. °· Controle todos los días la zona de la incisión para detectar signos de infección. Esté atenta a los siguientes signos: °? Aumento del enrojecimiento, la hinchazón o el dolor. °? Más líquido o sangre. °? Calor. °? Pus o mal olor. °· Cuando tosa o estornude, abrace una almohada. Esto ayuda con el dolor y disminuye la posibilidad de que su incisión  se abra (dehiscencia). Haga esto hasta que cicatrice completamente. °Medicamentos °· Tome los medicamentos de venta libre y los recetados solamente como se lo haya indicado el médico. °· Si le recetaron un antibiótico, tómelo como se lo haya indicado el médico. No interrumpa la administración del antibiótico hasta que no lo hay terminado. °Conducir °· No conduzca ni opere maquinaria pesada mientras toma analgésicos recetados. °· No conduzca durante 24 horas si le administraron un sedante. °Estilo de vida °· No beba alcohol. Esto es de suma importancia si está amamantando o toma analgésicos. °· No consuma productos que contengan tabaco, incluidos cigarrillos, tabaco de mascar o cigarrillos electrónicos. Si necesita ayuda para dejar de fumar, consulte al médico. El tabaco puede retrasar la cicatrización. °Comida y bebida °· Beba al menos 8 vasos de 8 onzas (240 cc) de agua todos los días a menos que el médico le indique lo contrario. Si amamanta, quizá deba beber aún más cantidad de agua. °· Ingiera alimentos ricos en fibras todos los días. Estos alimentos pueden ayudarla a prevenir o aliviar el estreñimiento. Los alimentos ricos en fibras incluyen, entre otros: °? Panes y cereales integrales. °? Arroz integral. °? Frijoles. °? Frutas y verduras frescas. °Actividad °· Reanude sus actividades normales como se lo haya indicado el médico. Pregúntele al médico qué actividades son seguras para usted. °· Descanse todo lo que pueda. Trate de descansar o tomar una siesta mientras el bebé duerme. °· No levante objetos que pesen más que su bebé o más de 10 libras (4,5 kg), como se lo haya indicado el médico. °· Pregúntele al médico cuándo puede volver a tener relaciones sexuales. Esto puede depender de lo siguiente: °? Riesgo de sufrir   infecciones. °? Velocidad de cicatrización. °? Comodidad y deseo de tener relaciones sexuales. °El baño °· No tome baños de inmersión, no nade ni use el jacuzzi hasta que el médico lo autorice.  Pregúntele al médico si puede ducharse. Tal vez solo le permitan darse baños de esponja hasta que la incisión se cure. °· Mantenga el vendaje seco, como se lo haya indicado el médico. °Instrucciones generales °· No use tampones ni se haga duchas vaginales hasta que el médico la autorice. °· Use lo siguiente: °? Ropa cómoda y suelta. °? Un sostén firme y bien ajustado. °· Controle la sangre que elimina por la vagina para detectar coágulos. Pueden tener el aspecto de grumos de color rojo oscuro, marrón o negro. °· Mantenga el perineo limpio y seco, como se lo haya indicado el médico. °· Cuando vaya al baño, siempre higienícese de adelante hacia atrás. °· Si es posible, consiga que alguien la ayude a cuidar de su bebé y con las tareas domésticas durante algunos días después de recibir el alta hospitalaria °· Concurra a todas las visitas de seguimiento para usted y el bebé, como se lo haya indicado el médico. Esto es importante. °SOLICITE ATENCIÓN MÉDICA SI: °· Tiene los siguientes síntomas: °? Una secreción vaginal con mal olor. °? Dificultad para orinar. °? Dolor al orinar. °? Aumento o disminución repentinos de la frecuencia con que defeca. °? Aumento del enrojecimiento, de la hinchazón o del dolor alrededor de la incisión. °? Aumento del líquido o de la sangre que sale de la incisión. °? Pus o mal olor en el lugar de la incisión. °? Fiebre. °? Una erupción cutánea. °? Poco interés o falta de interés en actividades que solían gustarle. °? Dudas sobre su cuidado y el del bebé. °? Náuseas. °· La incisión está caliente al tacto. °· Sus senos se ponen rojos o duros, o causan dolor. °· Siente tristeza o preocupación de forma inusual. °· Vomita. °· Elimina coágulos grandes por la vagina. Si elimina un coágulo de sangre, guárdelo para mostrárselo al médico. No elimine los coágulos sanguíneos por el inodoro sin mostrárselos a su médico. °· Orina más de lo habitual. °· Se siente mareada o se desmaya. °· No amamantó al bebé y  no tuvo su período menstrual en un período de 12 semanas posterior al parto. °· Usted dejó de amamantar al bebé y no tuvo su período menstrual en un período de 12 semanas posterior a haber dejado de amamantar. ° °SOLICITE ATENCIÓN MÉDICA DE INMEDIATO SI: °· Tiene los siguientes síntomas: °? Dolor que no desaparece o no mejora con el medicamento. °? Dolor en el pecho. °? Dificultad para respirar. °? Visión borrosa o manchas en la vista. °? Pensamientos de autolesionarse o lesionar al bebé. °? Nuevo dolor en el abdomen o en una de las piernas. °? Dolor de cabeza intenso. °· Se desmaya. °· Tiene una hemorragia tan intensa de la vagina que empapa dos toallitas sanitarias en una hora. ° °Esta información no tiene como fin reemplazar el consejo del médico. Asegúrese de hacerle al médico cualquier pregunta que tenga. °Document Released: 01/13/2005 Document Revised: 05/07/2015 Document Reviewed: 12/18/2014 °Elsevier Interactive Patient Education © 2017 Elsevier Inc. ° °

## 2016-02-05 ENCOUNTER — Ambulatory Visit (INDEPENDENT_AMBULATORY_CARE_PROVIDER_SITE_OTHER): Payer: Medicaid Other | Admitting: Medical

## 2016-02-05 ENCOUNTER — Other Ambulatory Visit: Payer: Self-pay | Admitting: Medical

## 2016-02-05 DIAGNOSIS — E039 Hypothyroidism, unspecified: Secondary | ICD-10-CM

## 2016-02-05 LAB — T3: T3, Total: 50 ng/dL — ABNORMAL LOW (ref 86–192)

## 2016-02-05 LAB — T4, FREE: Free T4: 0.4 ng/dL — ABNORMAL LOW (ref 0.8–1.4)

## 2016-02-05 LAB — TSH: TSH: 130.9 mIU/L — ABNORMAL HIGH

## 2016-02-05 MED ORDER — LEVOTHYROXINE SODIUM 150 MCG PO TABS
150.0000 ug | ORAL_TABLET | Freq: Every day | ORAL | 3 refills | Status: DC
Start: 2016-02-05 — End: 2017-02-20

## 2016-02-05 NOTE — Patient Instructions (Signed)
Contraception Choices Birth control (contraception) is the use of any methods or devices to stop pregnancy from happening. Below are some methods to help avoid pregnancy. Hormonal birth control  A small tube put under the skin of the upper arm (implant). The tube can stay in place for 3 years. The implant must be taken out after 3 years.  Shots given every 3 months.  Pills taken every day.  Patches that are changed once a week.  A ring put into the vagina (vaginal ring). The ring is left in place for 3 weeks and removed for 1 week. Then, a new ring is put in the vagina.  Emergency birth control pills taken after unprotected sex (intercourse). Barrier birth control  A thin covering worn on the penis (female condom) during sex.  A soft, loose covering put into the vagina (female condom) before sex.  A rubber bowl that sits over the cervix (diaphragm). The bowl must be made for you. The bowl is put into the vagina before sex. The bowl is left in place for 6 to 8 hours after sex.  A small, soft cup that fits over the cervix (cervical cap). The cup must be made for you. The cup can be left in place for 48 hours after sex.  A sponge that is put into the vagina before sex.  A chemical that kills or stops sperm from getting into the cervix and uterus (spermicide). The chemical may be a cream, jelly, foam, or pill. Intrauterine (IUD) birth control  IUD birth control is a small, T-shaped piece of plastic. The plastic is put inside the uterus. There are 2 types of IUD:  Copper IUD. The IUD is covered in copper wire. The copper makes a fluid that kills sperm. It can stay in place for 10 years.  Hormone IUD. The hormone stops pregnancy from happening. It can stay in place for 5 years. Permanent methods  When the woman has her fallopian tubes sealed, tied, or blocked during surgery. This stops the egg from traveling to the uterus.  The doctor places a small coil or insert into each fallopian  tube. This causes scar tissue to form and blocks the fallopian tubes.  When the female has the tubes that carry sperm tied off (vasectomy). Natural family planning birth control  Natural family planning means not having sex or using barrier birth control on the days the woman could become pregnant.  Use a calendar to keep track of the length of each period and know the days she can get pregnant.  Avoid sex during ovulation.  Use a thermometer to measure body temperature. Also watch for symptoms of ovulation.  Time sex to be after the woman has ovulated. Use condoms to help protect yourself against sexually transmitted infections (STIs). Do this no matter what type of birth control you use. Talk to your doctor about which type of birth control is best for you. This information is not intended to replace advice given to you by your health care provider. Make sure you discuss any questions you have with your health care provider. Document Released: 11/10/2008 Document Revised: 06/21/2015 Document Reviewed: 08/04/2012 Elsevier Interactive Patient Education  2017 Elsevier Inc.  

## 2016-02-05 NOTE — Progress Notes (Signed)
Subjective:     Terri Glenn is a 21 y.o. female who presents for a postpartum visit. She is 5 weeks postpartum following a low cervical transverse Cesarean section. I have fully reviewed the prenatal and intrapartum course. The delivery was at 37 gestational weeks. Outcome: primary cesarean section, low transverse incision. Anesthesia: spinal. Postpartum course has been normal. Baby's course has been normal. Baby is feeding by both breast and bottle - Similac Advance. Bleeding pink. Bowel function is normal. Bladder function is normal. Patient is not sexually active. Contraception method is abstinence. Postpartum depression screening: negative.  The following portions of the patient's history were reviewed and updated as appropriate: allergies, current medications, past family history, past medical history, past social history, past surgical history and problem list.  Review of Systems Pertinent items are noted in HPI.   Objective:    BP 118/74   Wt 147 lb 1.6 oz (66.7 kg)   BMI 26.90 kg/m   General:  alert and cooperative   Breasts:  not performed  Lungs: clear to auscultation bilaterally  Heart:  regular rate and rhythm, S1, S2 normal, no murmur, click, rub or gallop  Abdomen: soft, non-tender; bowel sounds normal; no masses,  no organomegaly incision is well-healed   Vulva:  not evaluated  Vagina: not evaluated  Cervix:  Not evaluated  Corpus: not examined  Adnexa:  not evaluated  Rectal Exam: Not performed.        Assessment:     Normal postpartum exam. Pap smear not done at today's visit.  Hypothyroidism Plan:    1. Contraception: condoms, Patient to decide on birth control and make an appointment or call for Rx 2. TSH, T3 and T4 drawn today. Patient will continue Synthroid at current dose and refer to [facility] for PCP care 3. Follow up in:  April for annual exam or as needed.    Marny LowensteinJulie N Wenzel, PA-C 02/05/2016 11:27 AM

## 2016-02-06 ENCOUNTER — Telehealth: Payer: Self-pay

## 2016-02-06 DIAGNOSIS — E039 Hypothyroidism, unspecified: Secondary | ICD-10-CM

## 2016-02-06 NOTE — Telephone Encounter (Signed)
Attempted to call patient with pacific interpreter-no answer voicemail has been left for patient.  Please inform patient that her thyroid levels are too high and we need to increase her dose of Synthroid. I have sent Rx for 150 mcg dose to her pharmacy. She will take this once a day. She definitely needs to [facility] for continued management. She was instructed to call them for an appointment at her visit today

## 2016-02-07 NOTE — Addendum Note (Signed)
Addended by: Kathee DeltonHILLMAN, CARRIE L on: 02/07/2016 11:50 AM   Modules accepted: Orders

## 2016-02-12 ENCOUNTER — Encounter: Payer: Self-pay | Admitting: General Practice

## 2016-03-17 ENCOUNTER — Ambulatory Visit: Payer: Self-pay | Admitting: Family Medicine

## 2016-03-31 ENCOUNTER — Encounter: Payer: Self-pay | Admitting: Medical

## 2016-11-24 ENCOUNTER — Ambulatory Visit (INDEPENDENT_AMBULATORY_CARE_PROVIDER_SITE_OTHER): Payer: Medicaid Other | Admitting: *Deleted

## 2016-11-24 ENCOUNTER — Other Ambulatory Visit (HOSPITAL_COMMUNITY)
Admission: RE | Admit: 2016-11-24 | Discharge: 2016-11-24 | Disposition: A | Discharge status: Home / Self Care | Payer: Medicaid Other | Source: Ambulatory Visit | Attending: Family Medicine | Admitting: Family Medicine

## 2016-11-24 DIAGNOSIS — N898 Other specified noninflammatory disorders of vagina: Secondary | ICD-10-CM | POA: Diagnosis not present

## 2016-11-24 DIAGNOSIS — B373 Candidiasis of vulva and vagina: Secondary | ICD-10-CM | POA: Insufficient documentation

## 2016-11-24 DIAGNOSIS — E079 Disorder of thyroid, unspecified: Secondary | ICD-10-CM

## 2016-11-24 DIAGNOSIS — B9689 Other specified bacterial agents as the cause of diseases classified elsewhere: Secondary | ICD-10-CM | POA: Insufficient documentation

## 2016-11-24 NOTE — Progress Notes (Signed)
Here for thick yellowish- white vaginal discharge. C/o a little vaginal itching. Self swab for wet prep done.

## 2016-11-25 LAB — CERVICOVAGINAL ANCILLARY ONLY
BACTERIAL VAGINITIS: POSITIVE — AB
CHLAMYDIA, DNA PROBE: NEGATIVE
Candida vaginitis: POSITIVE — AB
NEISSERIA GONORRHEA: NEGATIVE
TRICH (WINDOWPATH): NEGATIVE

## 2016-11-26 MED ORDER — METRONIDAZOLE 500 MG PO TABS: 500.0000 mg | ORAL_TABLET | Freq: Two times a day (BID) | ORAL | 0 refills | Status: DC

## 2016-11-26 MED ORDER — FLUCONAZOLE 150 MG PO TABS: 150.0000 mg | ORAL_TABLET | Freq: Every day | ORAL | 2 refills | Status: DC

## 2016-11-26 NOTE — Addendum Note (Signed)
Addended by: Reva BoresPRATT, Aditri Louischarles S on: 11/26/2016 02:19 PM   Modules accepted: Orders

## 2016-11-28 ENCOUNTER — Telehealth: Payer: Self-pay

## 2016-11-28 NOTE — Telephone Encounter (Signed)
Reva BoresPratt, Tanya S, MD  [facility] Clinical Pool        Has yeast and BV--orders sent in    LM for pt to please call the office its in regards to results.

## 2016-12-05 ENCOUNTER — Encounter: Payer: Self-pay | Admitting: General Practice

## 2016-12-15 NOTE — Telephone Encounter (Signed)
Message sent via MyChart.

## 2017-02-20 ENCOUNTER — Ambulatory Visit: Payer: Medicaid Other | Admitting: Family Medicine

## 2017-02-20 ENCOUNTER — Other Ambulatory Visit: Payer: Self-pay

## 2017-02-20 VITALS — BP 110/70 | HR 98 | Temp 98.5°F | Ht 62.0 in | Wt 151.2 lb

## 2017-02-20 DIAGNOSIS — E039 Hypothyroidism, unspecified: Secondary | ICD-10-CM

## 2017-02-20 DIAGNOSIS — Z23 Encounter for immunization: Secondary | ICD-10-CM

## 2017-02-20 NOTE — Progress Notes (Signed)
   Subjective:   Patient ID: Terri Glenn    DOB: 1995/07/02, 22 y.o. female   MRN: [id number]  CC: establish care   HPI: Terri Glenn is a 22 y.o. female who presents to clinic today to establish care with new PCP.  [place] a prior PCP here.  She has had a doctor for her prenatal care.  She lives at home with her mother, brother, sister and her 671-month-old daughter.    Hypothyroidism Reports symptoms of constipation, dry skin and leg swelling.  He denies any skin changes or weight gain.  She states her OB doctor stated she may have hypothyroidism.  She states she was previously on 150 mcg but has not been taking this over the past 1.5 years because she could not afford it.  She now has Medicaid and is following up to see if she needs to restart it.  No other issues today.  Health maintenance Flu shot given today  ROS: Denies fever, chills, nausea, vomiting, abdominal pain.  PMH -history of hypothyroidism  FHX - no known  Surg hx- 2017 c-section Social - never smoker, social alcohol use, no h/o drug use  Allergies -none  Medications reviewed. Objective:   BP 110/70 (BP Location: Right Arm, Patient Position: Sitting, Cuff Size: Normal)   Pulse 98   Temp 98.5 F (36.9 C) (Oral)   Ht 5\' 2"  (1.575 m)   Wt 151 lb 3.2 oz (68.6 kg)   SpO2 97%   BMI 27.65 kg/m  Vitals and nursing note reviewed.  General: 22 year old female, NAD HEENT: NCAT, EOMI, MMM, oropharynx is clear Neck: Supple, nontender CV: RRR no MRG Lungs: CTA B, nonlabored Abdomen: Soft, NT ND, positive bowel sounds Skin: warm, dry, no rash Extremities: warm and well perfused  Assessment & Plan:   Hypothyroid Previous TSH greater than 150 in September 2017.  Has not been on her Synthroid for about 1.5 years.  She is symptomatic with dry skin, leg swelling and constipation. -Suspect she would need to restart Synthroid -will recheck TSH  today -Plan to restart at low dose and titrate up as necessary  Health maintenance -Flu shot administered  Orders Placed This Encounter  Procedures  . Flu Vaccine QUAD 36+ mos IM  . TSH    Freddrick MarchYashika Fareed Fung, MD [facility], PGY-2 02/24/2017 6:06 PM

## 2017-02-20 NOTE — Patient Instructions (Signed)
You were seen today to establish care with me as your new PCP.  We reviewed your medical, family and surgical history, as well as your medications.  For your history of hypothyroidism, I have checked your TSH level today.  I will give you a call once this results and will let you know which dose I will restart you on as you have not been on this medication for about a year and a half.  This medication will be sent to your pharmacy and will be available for you to pick up.  Additionally, I would like for you to set up an appointment in about 1-2 weeks to address health maintenance issues as well as for your routine physical exam.   If you have any questions please feel free to call clinic.  Be well, Freddrick MarchYashika Kohan Azizi, MD

## 2017-02-21 LAB — TSH: TSH: 198.1 u[IU]/mL — ABNORMAL HIGH (ref 0.450–4.500)

## 2017-02-24 NOTE — Assessment & Plan Note (Addendum)
Previous TSH greater than 150 in September 2017.  Has not been on her Synthroid for about 1.5 years.  She is symptomatic with dry skin, leg swelling and constipation. -Suspect she would need to restart Synthroid -will recheck TSH today -Plan to restart at low dose and titrate up as necessary

## 2017-03-11 ENCOUNTER — Other Ambulatory Visit: Payer: Self-pay

## 2017-03-11 ENCOUNTER — Encounter: Payer: Self-pay | Admitting: Family Medicine

## 2017-03-11 ENCOUNTER — Ambulatory Visit (INDEPENDENT_AMBULATORY_CARE_PROVIDER_SITE_OTHER): Payer: Medicaid Other | Admitting: Family Medicine

## 2017-03-11 DIAGNOSIS — E039 Hypothyroidism, unspecified: Secondary | ICD-10-CM | POA: Diagnosis present

## 2017-03-11 MED ORDER — LEVOTHYROXINE SODIUM 75 MCG PO TABS
75.0000 ug | ORAL_TABLET | Freq: Every day | ORAL | 0 refills | Status: DC
Start: 2017-03-11 — End: 2017-05-01

## 2017-03-11 NOTE — Progress Notes (Signed)
   Subjective:   Patient ID: Terri Glenn    DOB: 1995/06/30, 22 y.o. female   MRN: [id number]  CC: Follow-up hypothyroidism, routine physical  HPI: Terri Glenn is a 22 y.o. female who presents to clinic today for the following issue.  Hypothyroidism Has not been taking her Synthroid for about a year and a half.  She reports symptoms of dry skin, leg swelling and constipation.  Reports that she feels tired more often.  Denies pain, shortness of breath, palpitations.  Patient's last TSH checked January and is elevated to 198.  Discussed this with patient at today's visit.  She has no other concerns at this time.  Routine physical exam today.   ROS: No fever, chills, nausea, vomiting.  No abdominal pain, shortness of breath, chest pain.  Social: Patient is a never smoker Medications reviewed. Objective:   BP (!) 82/62   Pulse 65   Temp 98.3 F (36.8 C) (Oral)   Ht 5\' 2"  (1.575 m)   Wt 150 lb 6.4 oz (68.2 kg)   SpO2 97%   BMI 27.51 kg/m  Vitals and nursing note reviewed.  General: Well-appearing 22 year old, NAD HEENT: NCAT, EOMI, PERRLA Neck: supple, nontender, no thyroid enlargement CV: RRR no MRG Lungs: CTAB, normal effort Abdomen: Soft, NT ND, positive bowel sounds  skin: warm, skin is dry Extremities: warm and well perfused, normal tone  Assessment & Plan:   Hypothyroid TSH elevated to 198 and symptomatic with dry skin, hair loss and fatigue.  No red flags on exam. -Restart Synthroid today at 75 mcg; will titrate up over the next several weeks -We will recheck TSH in 3-4 months  -return precautions discussed  Meds ordered this encounter  Medications  . levothyroxine (SYNTHROID, LEVOTHROID) 75 MCG tablet    Sig: Take 1 tablet (75 mcg total) by mouth daily.    Dispense:  60 tablet    Refill:  0    Freddrick MarchYashika Vinia Jemmott, MD [facility], PGY-2 03/20/2017 5:34 PM

## 2017-03-11 NOTE — Patient Instructions (Signed)
You were seen in clinic today for a routine annual physical exam and follow-up of your hypothyroidism.  As we discussed, your TSH is significantly elevated at 198.  I am restarting your Synthroid at 75 mcg.  Please take this daily before breakfast  over the next several weeks and we will titrate up to your original dose of 125-150 mcg.  I would like for you to follow-up with me in 2 months time.  Please call clinic if you have any questions.  Be well, Freddrick MarchYashika Tziporah Knoke MD

## 2017-03-20 NOTE — Assessment & Plan Note (Addendum)
TSH elevated to 198 and symptomatic with dry skin, hair loss and fatigue.  No red flags on exam. -Restart Synthroid today at 75 mcg; will titrate up over the next several weeks -We will recheck TSH in 3-4 months  -return precautions discussed

## 2017-05-01 ENCOUNTER — Ambulatory Visit: Payer: Medicaid Other | Admitting: Family Medicine

## 2017-05-01 ENCOUNTER — Other Ambulatory Visit: Payer: Self-pay

## 2017-05-01 ENCOUNTER — Encounter: Payer: Self-pay | Admitting: Family Medicine

## 2017-05-01 DIAGNOSIS — E039 Hypothyroidism, unspecified: Secondary | ICD-10-CM | POA: Diagnosis not present

## 2017-05-01 MED ORDER — LEVOTHYROXINE SODIUM 100 MCG PO TABS: 100.0000 ug | ORAL_TABLET | Freq: Every day | ORAL | 2 refills | Status: DC

## 2017-05-01 NOTE — Patient Instructions (Signed)
It was nice seeing you again today!  You were seen in clinic for follow-up of your thyroid.    We reviewed your labs and increased your Synthroid dose today from 75 mcg to 100 mcg daily since you are having symptoms of dry skin and hair falling out.  I suspect this is a better dose for you to be on however we will recheck your TSH in 1 month and adjust accordingly.  Please make an appointment with me in 1 month for labs.  Be well, Freddrick MarchYashika Franklin Baumbach MD

## 2017-05-01 NOTE — Progress Notes (Signed)
   Subjective:   Patient ID: Terri Glenn    DOB: [birth year], 22 y.o. female   MRN: [id number]  CC: f/u thyroid   HPI: Terri Glenn is a 22 y.o. female who presents to clinic today for f/u hypothyroidism.  Hypothyroidism  Pt reports has not taken synthroid in 1.5 years.   This was restarted at last visit due to elevated TSH of 198. Started 75 mcg levothyroxine and she has been taking it daily now with good compliance.  She endorses symptoms of hair fall-out, dry skin and has lost about 5 lbs in 2 months.    ROS: No fever, chills, nausea, vomiting.  No dizziness, shortness of breath, palpitations.    Social: pt is a never smoker  Medications reviewed. Objective:   BP 112/62   Pulse (!) 58   Temp 98.2 F (36.8 C) (Oral)   Ht 5\' 2"  (1.575 m)   Wt 145 lb (65.8 kg)   LMP 04/08/2017   SpO2 98%   BMI 26.52 kg/m  Vitals and nursing note reviewed.  General: 22 yo female, NAD  HEENT: EOMI, PERRL, MMM,  Neck: supple, no thyromegaly CV: RRR no MRG  Lungs: CTAB, normal effort  Abdomen: soft, NTND, +bs  Skin: warm, dry, no rash  Extremities: warm and well perfused, normal tone Neuro: alert, ox3, no focal deficits   Assessment & Plan:   Hypothyroid -Increase Synthroid to 100 mcg daily  - Plan to recheck TSH in 1 month to make sure it is normalizing   Meds ordered this encounter  Medications  . levothyroxine (SYNTHROID, LEVOTHROID) 100 MCG tablet    Sig: Take 1 tablet (100 mcg total) by mouth daily.    Dispense:  30 tablet    Refill:  2   F/u 1 month  Freddrick MarchYashika Santa Abdelrahman, MD [facility], PGY-2 05/11/2017 11:27 AM

## 2017-05-11 NOTE — Assessment & Plan Note (Signed)
-  Increase Synthroid to 100 mcg daily  - Plan to recheck TSH in 1 month to make sure it is normalizing

## 2017-05-29 ENCOUNTER — Encounter: Payer: Self-pay | Admitting: Family Medicine

## 2017-05-29 ENCOUNTER — Ambulatory Visit (INDEPENDENT_AMBULATORY_CARE_PROVIDER_SITE_OTHER): Payer: Medicaid Other | Admitting: Family Medicine

## 2017-05-29 ENCOUNTER — Other Ambulatory Visit: Payer: Self-pay

## 2017-05-29 VITALS — BP 98/62 | HR 72 | Temp 98.2°F | Ht 62.0 in | Wt 151.2 lb

## 2017-05-29 DIAGNOSIS — E039 Hypothyroidism, unspecified: Secondary | ICD-10-CM | POA: Diagnosis present

## 2017-05-29 NOTE — Assessment & Plan Note (Signed)
Stable.  No red flags on exam.  Patient notes her symptoms have somewhat improved over the last few weeks. -Check TSH today -Continue Synthroid 100 mcg daily for now  -Will increase Synthroid to 125 mcg if no improvement in TSH

## 2017-05-29 NOTE — Patient Instructions (Signed)
It was nice seeing you again today!  You were seen in clinic for follow-up of your hypothyroidism and we checked your TSH today.    I would like you to continue taking 100 mcg of Synthroid daily and I will call you with the results of your lab work today.  At that time, I'll either send in a refill of your 100 mcg or increase your dose if your TSH has not shown much improvement.  Please call clinic if you have any questions.  Be well, Freddrick March MD

## 2017-05-29 NOTE — Progress Notes (Signed)
   Subjective:   Patient ID: Terri Glenn    DOB: [birth year], 22 y.o. female   MRN: [id number]  CC: f/u hypothyroidism   HPI: Terri Glenn is a 22 y.o. female who presents to clinic today for the following issue.  Hypothyroidism Synthroid increased last month to 100 mcg daily.   Reports good medication compliance.   Symptoms: no weight gain,or palpitations.  She reports some improvement of dry skin and less hair fall-out.  Denies cold intolerance, fatigue, memory issues.     ROS: No fever, chills, nausea, vomiting.  No CP, SOB, leg swelling.    Social: pt is a never smoker    Medications reviewed. Objective:   BP 98/62   Pulse 72   Temp 98.2 F (36.8 C) (Oral)   Ht  (1.575 m)   Wt 151 lb 3.2 oz (68.6 kg)   SpO2 99%   BMI 27.65 kg/m  Vitals and nursing note reviewed.  General: Well-appearing 22 year old, NAD HEENT: NCAT, EOMI, PERRLA, and, oropharynx clear Neck: supple, nontender, no thyromegaly CV: RRR no MRG Lungs: CTA B, normal effort Abdomen: soft, NTND,+bs Skin: warm, dry, no rashes or lesions, brisk cap refill Extremities: warm and well perfused, normal tone  Assessment & Plan:   Hypothyroid Stable.  No red flags on exam.  Patient notes her symptoms have somewhat improved over the last few weeks. -Check TSH today -Continue Synthroid 100 mcg daily for now  -Will increase Synthroid to 125 mcg if no improvement in TSH  Orders Placed This Encounter  Procedures  . TSH   Follow up: 2 months   Freddrick March, MD [facility], PGY-2 05/29/2017 4:56 PM

## 2017-05-30 ENCOUNTER — Other Ambulatory Visit: Payer: Self-pay | Admitting: Family Medicine

## 2017-05-30 LAB — TSH: TSH: 171.3 u[IU]/mL — ABNORMAL HIGH (ref 0.450–4.500)

## 2017-05-30 MED ORDER — LEVOTHYROXINE SODIUM 125 MCG PO TABS: 125.0000 ug | ORAL_TABLET | Freq: Every day | ORAL | 2 refills | Status: DC

## 2017-08-11 ENCOUNTER — Ambulatory Visit: Payer: Medicaid Other | Admitting: Family Medicine

## 2017-08-24 ENCOUNTER — Encounter: Payer: Self-pay | Admitting: Family Medicine

## 2017-08-24 ENCOUNTER — Ambulatory Visit (INDEPENDENT_AMBULATORY_CARE_PROVIDER_SITE_OTHER): Payer: Medicaid Other | Admitting: Family Medicine

## 2017-08-24 ENCOUNTER — Other Ambulatory Visit: Payer: Self-pay

## 2017-08-24 VITALS — BP 100/70 | HR 88 | Temp 98.7°F | Ht 62.0 in | Wt 153.0 lb

## 2017-08-24 DIAGNOSIS — E039 Hypothyroidism, unspecified: Secondary | ICD-10-CM | POA: Diagnosis not present

## 2017-08-24 MED ORDER — LEVOTHYROXINE SODIUM 125 MCG PO TABS: 125.0000 ug | ORAL_TABLET | Freq: Every day | ORAL | 2 refills | Status: DC

## 2017-08-24 NOTE — Progress Notes (Signed)
   Subjective:   Patient ID: Terri BeechamCynthia Glenn    DOB: 04/20/1995, 22 y.o. female   MRN: [id number]  CC: f/u thyroid  HPI: Terri Glenn is a 22 y.o. female who presents to clinic today for the following issue.  Hypothyroidism Taking 125 mcg synthroid daily, reports good compliance.    No side effects noted.  No longer feeling tired and no recent weight changes.   She has noticed her hair on scalp starting to grow back.  Skin appears less drier and area of dry skin over bilateral eyelids has resolved.    Menstrual cycles are regular with LMP July 5th 2019.    Denies cold intolerance, dry mouth.  She endorses constipation sometimes.  Has been eating a lot of fibre and drinking plenty of water.  Drinking about 4 bottles of water a day. No leg swelling.   ROS: No fever, chills, nausea, vomiting.  No abdominal pain.  No chest pain, shortness of breath.  Social: Patient is a never smoker. Medications reviewed. Objective:   BP 100/70   Pulse 88   Temp 98.7 F (37.1 C) (Oral)   Ht 5\' 2"  (1.575 m)   Wt 153 lb (69.4 kg)   LMP 07/27/2017   SpO2 98%   BMI 27.98 kg/m  Vitals and nursing note reviewed.  General: Well-appearing 22 year old female, NAD HEENT: NCAT, EOMI, PERRL, MMM, oropharynx clear Neck: supple, non-tender, normal ROM, no LAD  CV: RRR no MRG  Lungs: CTAB, normal effort  Abdomen: soft, NTND, no masses or organomegaly, +bs  Skin: warm, dry, no rash Extremities: warm and well perfused, normal tone Neuro: alert, oriented x3, no focal deficits   Assessment & Plan:   Hypothyroid Good response to Synthroid 125 mcg with marked improvement in her symptoms.  No red flags on exam.  Plan to recheck TSH in 2 months time.  Will adjust dose at that time if needed.  Refill provided.  Return precautions discussed.   Meds ordered this encounter  Medications  . levothyroxine (SYNTHROID, LEVOTHROID) 125 MCG tablet    Sig: Take 1 tablet (125 mcg total) by mouth daily.   Dispense:  90 tablet    Refill:  2   Follow up: 2 months   Freddrick MarchYashika Jadakiss Barish, MD [facility], PGY-3 08/24/2017 4:10 PM

## 2017-08-24 NOTE — Patient Instructions (Signed)
It was nice seeing you again today!  You are seen in clinic for follow-up of your hypothyroidism and seems to be doing well on this dose with improvement in your symptoms.  I would recommend continuing to take 125 mcg of Synthroid daily.  I will have you follow-up in about 2 months to recheck your TSH.  We will reevaluate your current dose at that time.  Please call clinic if you have any questions.  Be well, Freddrick MarchYashika Shivansh Hardaway MD

## 2017-08-24 NOTE — Assessment & Plan Note (Signed)
Good response to Synthroid 125 mcg with marked improvement in her symptoms.  No red flags on exam.  Plan to recheck TSH in 2 months time.  Will adjust dose at that time if needed.  Refill provided.  Return precautions discussed.

## 2017-10-29 ENCOUNTER — Encounter: Payer: Self-pay | Admitting: Family Medicine

## 2017-10-29 ENCOUNTER — Ambulatory Visit (INDEPENDENT_AMBULATORY_CARE_PROVIDER_SITE_OTHER): Payer: Medicaid Other | Admitting: Family Medicine

## 2017-10-29 ENCOUNTER — Other Ambulatory Visit: Payer: Self-pay

## 2017-10-29 VITALS — BP 108/66 | HR 88 | Temp 98.8°F | Ht 62.0 in | Wt 161.8 lb

## 2017-10-29 DIAGNOSIS — E039 Hypothyroidism, unspecified: Secondary | ICD-10-CM | POA: Diagnosis present

## 2017-10-29 MED ORDER — LEVOTHYROXINE SODIUM 150 MCG PO TABS: 150.0000 ug | ORAL_TABLET | Freq: Every day | ORAL | 3 refills | Status: DC

## 2017-10-29 NOTE — Assessment & Plan Note (Signed)
Patient on 125 mcg of Synthroid currently, tolerating it well.  She reports good compliance with no side effects reported.  Check TSH today.  Increased to 150 mcg daily. Follow-up in 3 months to recheck.

## 2017-10-29 NOTE — Progress Notes (Signed)
   Subjective:   Patient ID: Terri Glenn    DOB: May 25, 1995, 22 y.o. female   MRN: [id number]  CC: f/u hypothyroidism   HPI: Terri Glenn is a 22 y.o. female who presents to clinic today for the following issues.  Hypothyroidism Denies any new or worsening symptoms.  She feels less fatigued, no hair fallout or dry skin.  She has been using lotions to keep her skin well moisturized. She has had about 8 lbs weight gain but otherwise doing well.  No leg swelling, palpitations.  No changes in her menstrual cycle.  LMP was 10/01/17.  Denies cold intolerance, constipation, dry mouth.    ROS: No fever, chills, nausea, vomiting.  No abdominal pain.  No chest pain, shortness of breath.     Social: pt is a never smoker.   Medications reviewed. Objective:   BP 108/66   Pulse 88   Temp 98.8 F (37.1 C) (Oral)   Ht 5\' 2"  (1.575 m)   Wt 161 lb 12.8 oz (73.4 kg)   SpO2 98%   BMI 29.59 kg/m  Vitals and nursing note reviewed.  General: 22 year old female, NAD HEENT: NCAT, EOMI, PERRLA, MMM, oropharynx clear  Neck: supple, non-tender, normal ROM, no LAD  CV: RRR no MRG  Lungs: CTAB, normal effort  Skin: warm, dry, no rash Extremities: warm and well perfused Neuro: alert, oriented x3, no focal deficits   Assessment & Plan:   Hypothyroid Patient on 125 mcg of Synthroid currently, tolerating it well.  She reports good compliance with no side effects reported.  Check TSH today.  Increased to 150 mcg daily. Follow-up in 3 months to recheck.  Orders Placed This Encounter  Procedures  . TSH   Meds ordered this encounter  Medications  . levothyroxine (SYNTHROID, LEVOTHROID) 150 MCG tablet    Sig: Take 1 tablet (150 mcg total) by mouth daily.    Dispense:  90 tablet    Refill:  3    Freddrick March, MD [facility], PGY-3 10/29/2017 4:46 PM

## 2017-10-29 NOTE — Patient Instructions (Addendum)
It was nice seeing you again today.  You were seen in clinic for follow-up of your hypothyroidism.  We have rechecked your TSH level today and increased you to 150 mcg daily of your Synthroid.  You can follow-up and 3 months to recheck your level.  Please call clinic if you have any questions.   Freddrick March MD

## 2017-10-30 LAB — TSH: TSH: 1.51 u[IU]/mL (ref 0.450–4.500)

## 2018-01-21 ENCOUNTER — Encounter: Payer: Self-pay | Admitting: Family Medicine

## 2018-01-21 ENCOUNTER — Other Ambulatory Visit (HOSPITAL_COMMUNITY)
Admission: RE | Admit: 2018-01-21 | Discharge: 2018-01-21 | Disposition: A | Discharge status: Home / Self Care | Payer: Medicaid Other | Source: Ambulatory Visit | Attending: Family Medicine | Admitting: Family Medicine

## 2018-01-21 ENCOUNTER — Ambulatory Visit (INDEPENDENT_AMBULATORY_CARE_PROVIDER_SITE_OTHER): Payer: Medicaid Other | Admitting: Family Medicine

## 2018-01-21 ENCOUNTER — Other Ambulatory Visit: Payer: Self-pay

## 2018-01-21 VITALS — BP 110/68 | HR 74 | Temp 98.4°F | Ht 62.0 in | Wt 171.0 lb

## 2018-01-21 DIAGNOSIS — Z23 Encounter for immunization: Secondary | ICD-10-CM | POA: Diagnosis not present

## 2018-01-21 DIAGNOSIS — Z124 Encounter for screening for malignant neoplasm of cervix: Secondary | ICD-10-CM | POA: Insufficient documentation

## 2018-01-21 DIAGNOSIS — E039 Hypothyroidism, unspecified: Secondary | ICD-10-CM

## 2018-01-21 DIAGNOSIS — Z3009 Encounter for other general counseling and advice on contraception: Secondary | ICD-10-CM | POA: Diagnosis not present

## 2018-01-21 DIAGNOSIS — Z309 Encounter for contraceptive management, unspecified: Secondary | ICD-10-CM | POA: Insufficient documentation

## 2018-01-21 DIAGNOSIS — Z308 Encounter for other contraceptive management: Secondary | ICD-10-CM | POA: Diagnosis not present

## 2018-01-21 DIAGNOSIS — Z3202 Encounter for pregnancy test, result negative: Secondary | ICD-10-CM

## 2018-01-21 LAB — POCT URINE PREGNANCY: Preg Test, Ur: NEGATIVE

## 2018-01-21 MED ORDER — NORGESTIMATE-ETH ESTRADIOL 0.25-35 MG-MCG PO TABS: 1.0000 | ORAL_TABLET | Freq: Every day | ORAL | 11 refills | Status: DC

## 2018-01-21 MED ORDER — LEVOTHYROXINE SODIUM 150 MCG PO TABS: 150.0000 ug | ORAL_TABLET | Freq: Every day | ORAL | 3 refills | Status: DC

## 2018-01-21 NOTE — Patient Instructions (Signed)
It was nice seeing you again today.  You were seen in clinic for follow-up of your hypothyroidism and for health maintenance.  You received a flu shot today.  We also performed a Pap smear and chlamydia screen, and I will call you once the results of this return.  In the meantime, I have not made any changes to your medications and have refilled your Synthroid.  Please return in 3 months to recheck your TSH at that time.    I also prescribed you oral contraceptive pills and checked a urine pregnancy test.  As we discussed, it is important to make sure you take this pill at the same time every day and do not skip days.  You may call clinic if you have any questions.  Freddrick MarchYashika Shila Kruczek MD

## 2018-01-21 NOTE — Assessment & Plan Note (Signed)
Stable on 150 mcg of Synthroid daily.  Reports good compliance and improvement in hair and skin.  No red flags on exam. -Plan to recheck TSH in 3 months -Levothyroxine refilled

## 2018-01-21 NOTE — Assessment & Plan Note (Addendum)
Patient interested in starting birth control, she is sexually active currently.  Discussed LARCs, patient prefers OCPs at this time but will think about another form and come in if she changes her mind. -POC urine preg negative at OV today -Counseled on safe sex -advised to take the pill at the same time everyday, given ways to help remember such as setting a daily alarm or leaving on night stand  -Rx: Sprintec, 11 refills

## 2018-01-21 NOTE — Progress Notes (Signed)
   Subjective:   Patient ID: Terri Glenn    DOB: May 11, 1995, 22 y.o. female   MRN: [id number]  CC: hypothyroidism, health maintenance  HPI: Terri Glenn is a 22 y.o. female who presents to clinic today for the following issues.  Hypothyroidism Patient denies current symptoms, no further hair loss or skin dryness.  She does note swelling at the end of each day but has not noticed significant changes in weight.  No palpitations or cold intolerance.  Has been taking her Synthroid with good compliance.    Contraception management  Patient requests oral contraceptive pills for birth control.  She is not currently pregnant and is sexually active.  She has considered other forms of birth control but would like OCPs for now.  No other concerns at this time.    Health maintenance:  -due for Pap smear -due for flu shot   ROS: No fever, chills, nausea, vomiting.  No shortness of breath, chest pain, palpitations.  Social: Patient is a never smoker. Medications reviewed. Objective:   BP 110/68   Pulse 74   Temp 98.4 F (36.9 C) (Oral)   Ht 5\' 2"  (1.575 m)   Wt 171 lb (77.6 kg)   LMP 01/01/2018 (Exact Date)   Breastfeeding No   BMI 31.28 kg/m  Vitals and nursing note reviewed.  General: Pleasant 22 year old female, NAD HEENT: NCAT, EOMI, PERRL, MMM, oropharynx clear Neck: supple CV: RRR no MRG  Lungs: CTAB, normal effort  Skin: warm, dry, no rash Extremities: No swelling or edema noted  Assessment & Plan:   Hypothyroid Stable on 150 mcg of Synthroid daily.  Reports good compliance and improvement in hair and skin.  No red flags on exam. -Plan to recheck TSH in 3 months -Levothyroxine refilled  Contraception management Patient interested in starting birth control, she is sexually active currently.  Discussed LARCs, patient prefers OCPs at this time but will think about another form and come in if she changes her mind. -POC urine preg negative at OV  today -Counseled on safe sex -advised to take the pill at the same time everyday, given ways to help remember such as setting a daily alarm or leaving on night stand  -Rx: Sprintec, 11 refills  Health maintenance: -chlamydia screening -Pap smear today -flu shot given this OV  Orders Placed This Encounter  Procedures  . Flu Vaccine QUAD 36+ mos IM  . POCT urine pregnancy   Meds ordered this encounter  Medications  . levothyroxine (SYNTHROID, LEVOTHROID) 150 MCG tablet    Sig: Take 1 tablet (150 mcg total) by mouth daily.    Dispense:  90 tablet    Refill:  3  . norgestimate-ethinyl estradiol (ORTHO-CYCLEN,SPRINTEC,PREVIFEM) 0.25-35 MG-MCG tablet    Sig: Take 1 tablet by mouth daily.    Dispense:  1 Package    Refill:  11   Follow up: 3 months or sooner if needed   Freddrick MarchYashika Evah Rashid, MD [facility]

## 2018-01-22 LAB — CERVICOVAGINAL ANCILLARY ONLY
Chlamydia: NEGATIVE
NEISSERIA GONORRHEA: NEGATIVE
Trichomonas: NEGATIVE

## 2018-01-25 LAB — CYTOLOGY - PAP
Diagnosis: NEGATIVE
HPV: NOT DETECTED

## 2018-04-20 ENCOUNTER — Telehealth: Payer: Self-pay | Admitting: Family Medicine

## 2018-04-20 NOTE — Telephone Encounter (Signed)
Pt called to schedule her follow up for her thyroid. I told her that most of our office visits are being done over the phone. She said that was fine and I told her Dr. Nelson Chimes would call her to discuss.

## 2018-04-23 NOTE — Telephone Encounter (Signed)
Attempted to call pt to discuss however unable to reach and have left a VM letting her know to call back.  She may let front office know if she needs a refill or do this electronically, but is otherwise ok to reschedule for 2 months from now.

## 2018-04-27 ENCOUNTER — Other Ambulatory Visit: Payer: Self-pay | Admitting: Family Medicine

## 2018-04-27 MED ORDER — LEVOTHYROXINE SODIUM 150 MCG PO TABS: 150.0000 ug | ORAL_TABLET | Freq: Every day | ORAL | 3 refills | Status: DC

## 2018-09-19 IMAGING — US US MFM OB COMP +14 WKS
1 series · 14 of 28 positions shown · non-contrast
Comparison: none

[Series 1: us mfm ob comp +14 wks · 111 acquisitions, 14 frames shown]
[im 5/111]
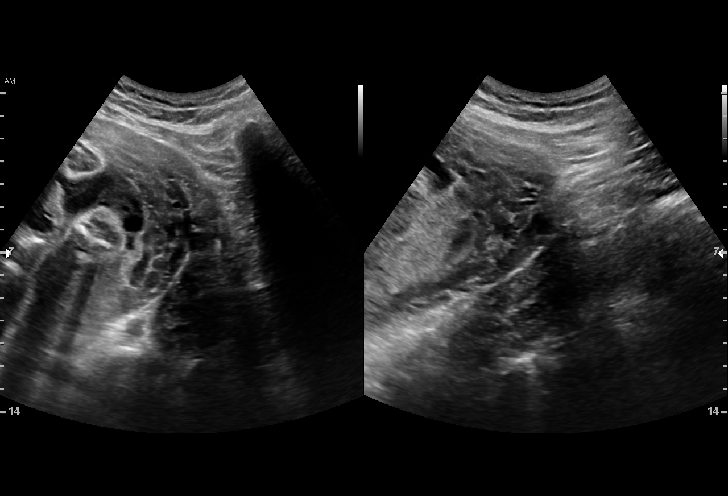
[im 13/111]
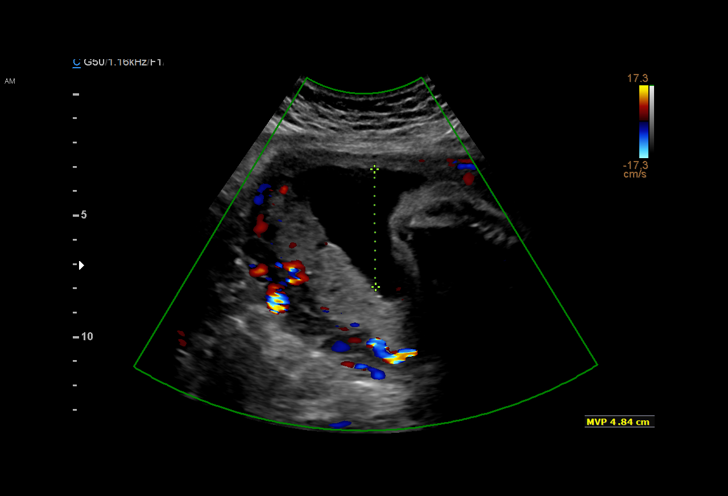
[im 21/111]
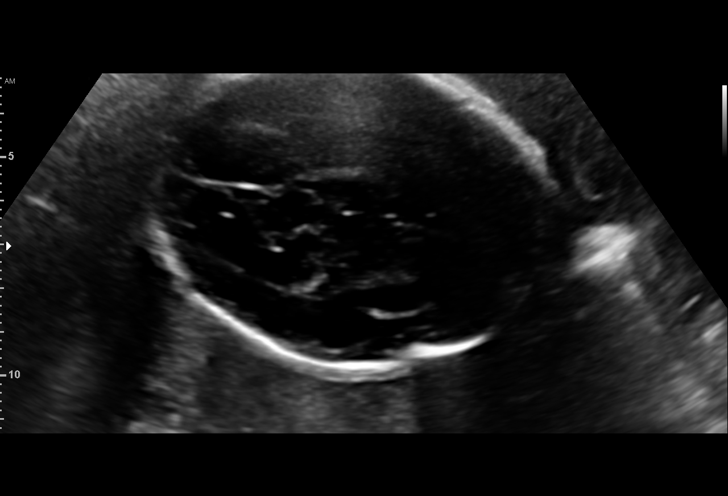
[im 29/111]
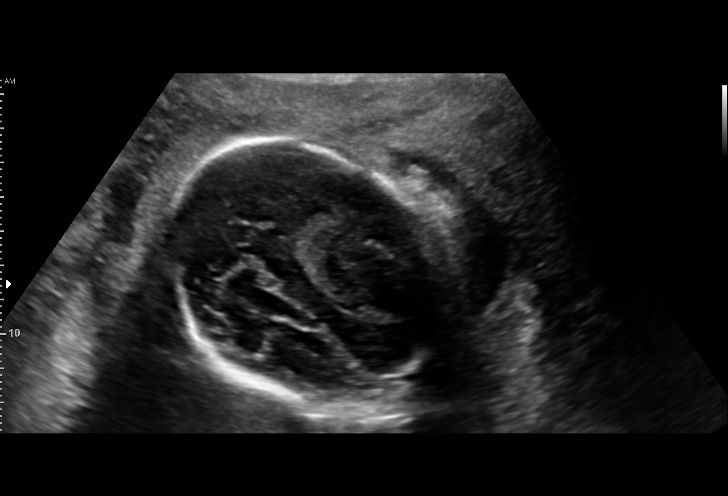
[im 37/111]
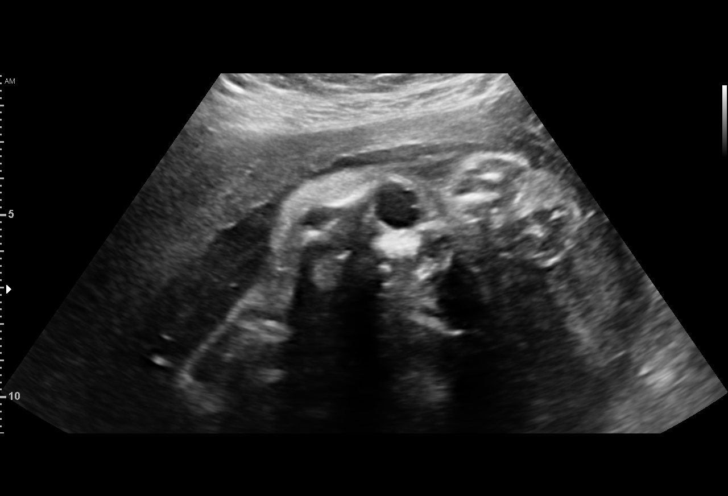
[im 45/111]
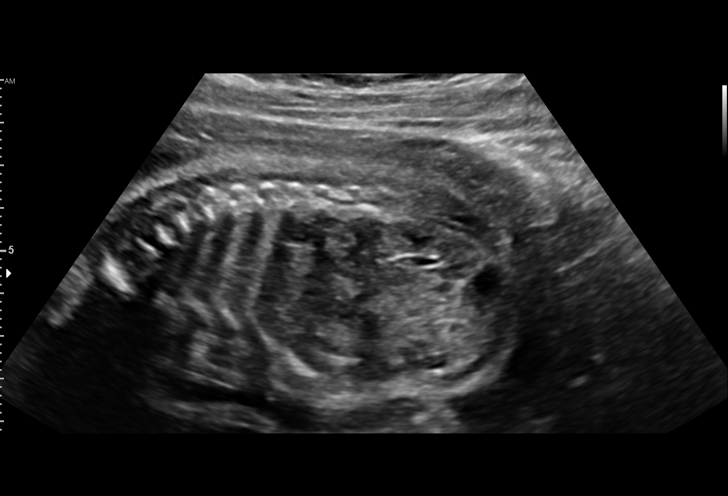
[im 53/111]
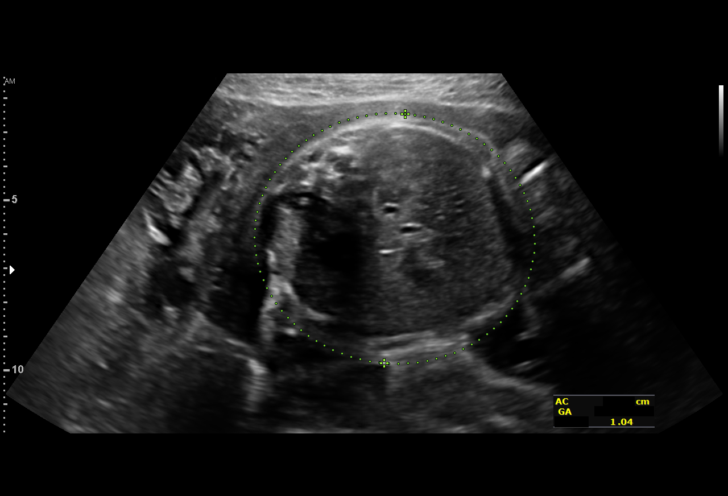
[im 62/111]
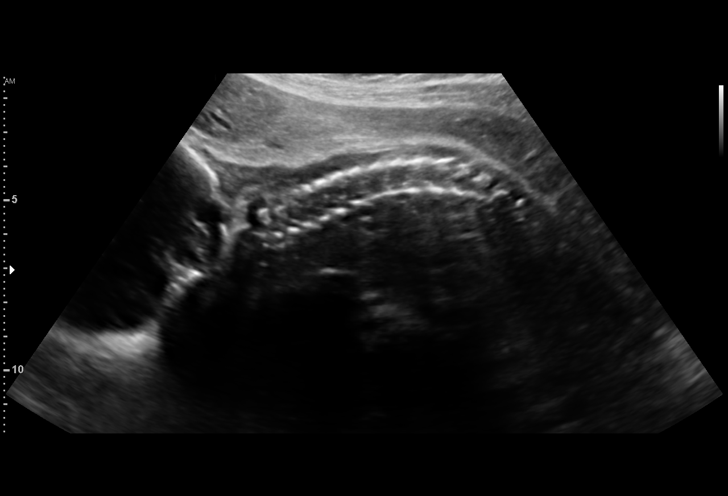
[im 70/111]
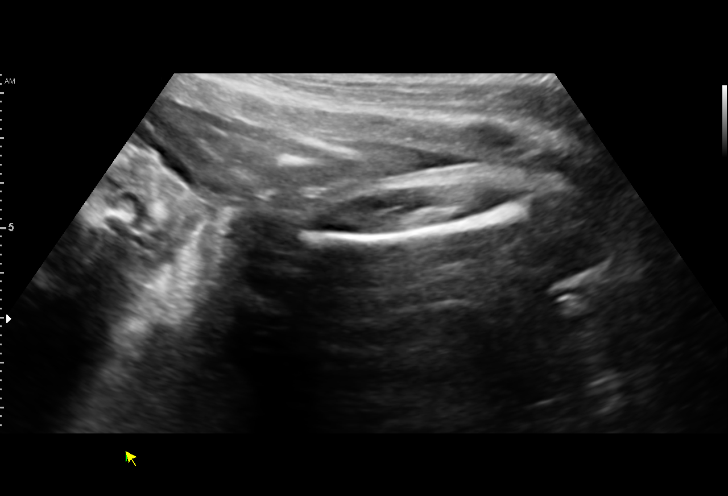
[im 78/111]
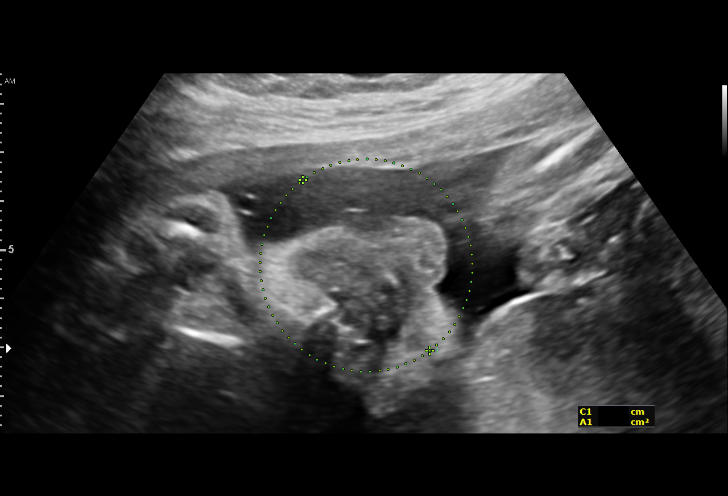
[im 86/111]
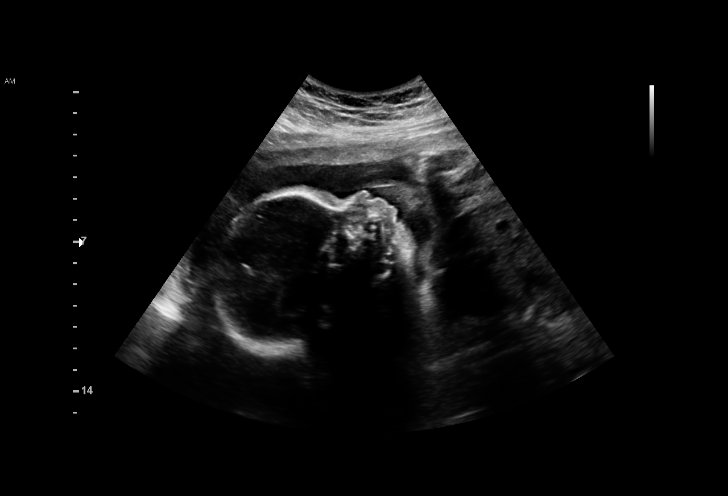
[im 94/111]
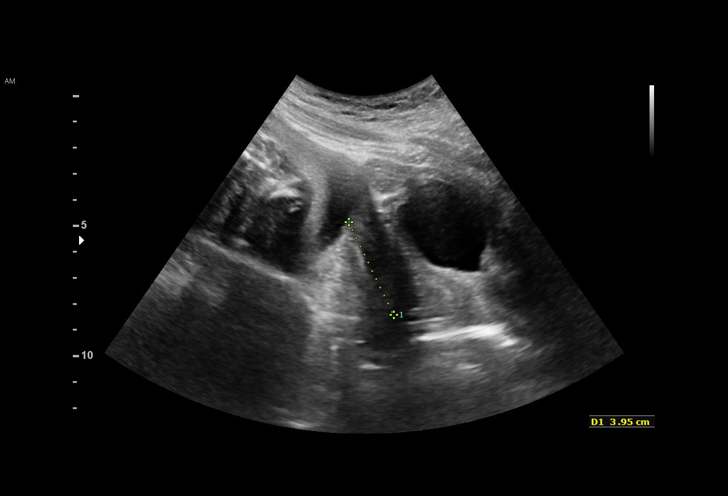
[im 102/111]
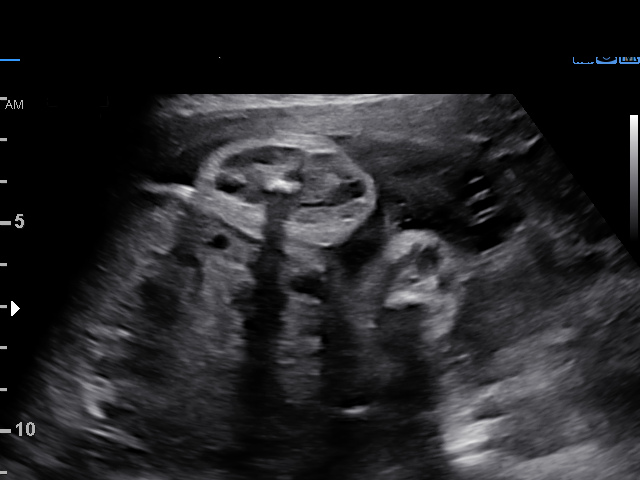
[im 111/111]
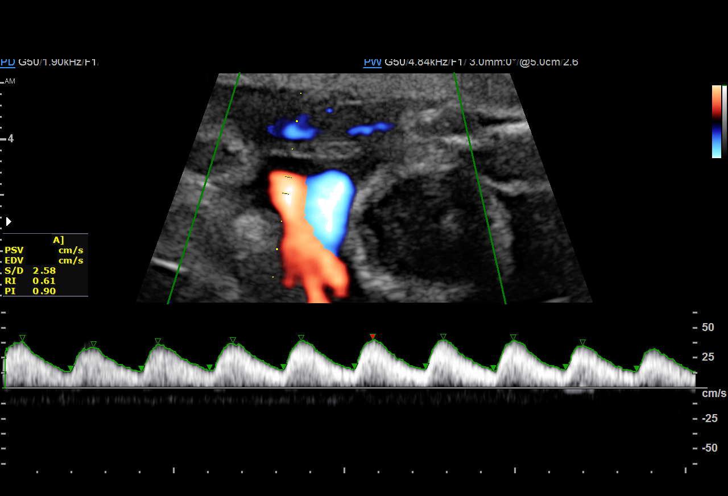

[14 of 28 positions shown; findings below may reference images not displayed]

OSAKE CNM

1  HLIWA TENGER          [id number]       [id number]     [id number]
Indications

28 weeks gestation of pregnancy
Basic anatomic survey                          Z36
Hypothyroid on replacement
OB History

Gravidity:    1
Fetal Evaluation

Num Of Fetuses:     1
Fetal Heart         151
Rate(bpm):
Cardiac Activity:   Observed
Presentation:       Breech
Placenta:           Posterior Fundal, above cervical os
P. Cord Insertion:  Marginal insertion

Amniotic Fluid
AFI FV:      Subjectively low-normal

Largest Pocket(cm)
4.84
Biometry

BPD:      71.6  mm     G. Age:  28w 5d         48  %    CI:         75.6   %   70 - 86
FL/HC:      19.8   %   18.8 -
HC:      261.1  mm     G. Age:  28w 3d         19  %    HC/AC:      1.05       1.05 -
AC:      247.7  mm     G. Age:  29w 0d         60  %    FL/BPD:     72.2   %   71 - 87
FL:       51.7  mm     G. Age:  27w 4d         16  %    FL/AC:      20.9   %   20 - 24
CER:        32  mm     G. Age:  28w 0d         44  %
CM:        5.9  mm

Est. FW:    9269  gm    2 lb 11 oz      51  %
Gestational Age

Clinical EDD:  26w 3d                                        EDD:   01/29/16
U/S Today:     28w 3d                                        EDD:   01/15/16
Best:          28w 3d    Det. By:   U/S (10/26/15)           EDD:   01/15/16
Anatomy

Cranium:               Appears normal         Aortic Arch:            Appears normal
Cavum:                 Appears normal         Ductal Arch:            Not well visualized
Ventricles:            Appears normal         Diaphragm:              Appears normal
Choroid Plexus:        Appears normal         Stomach:                Appears normal, left
sided
Cerebellum:            Appears normal         Abdomen:                Appears normal
Posterior Fossa:       Appears normal         Abdominal Wall:         Appears nml (cord
insert, abd wall)
Nuchal Fold:           Not applicable (>20    Cord Vessels:           Appears normal (3
wks GA)                                        vessel cord)
Face:                  Appears normal         Kidneys:                Appear normal
(orbits and profile)
Lips:                  Appears normal         Bladder:                Appears normal
Thoracic:              Appears normal         Spine:                  Appears normal
Heart:                 Appears normal         Upper Extremities:      Not well visualized
(4CH, axis, and si
RVOT:                  Appears normal         Lower Extremities:      Appears normal
LVOT:                  Appears normal

Other:  Fetus appears to be a female. Nasal bone visualized. Technically
difficult due to fetal position. Technically difficult due to low amniotic
fluid.
Cervix Uterus Adnexa

Cervix
Length:            3.4  cm.
Normal appearance by transabdominal scan.

Uterus
No abnormality visualized.

Left Ovary
Not visualized.

Right Ovary
Not visualized.

Adnexa:       No abnormality visualized.
Impression

SIUP at 28+3 weeks
Normal detailed fetal anatomy; limited views of DA and upper
extremities
Low normal amniotic fluid volume, subjectively
EDC based on today's measurements: 01/15/16
Recommendations
Follow-up ultrasound for growth in 4 weeks

## 2018-10-17 IMAGING — US US MFM OB FOLLOW-UP
1 series · 14 of 28 positions shown · non-contrast
Comparison: none

[Series 1: us mfm ob follow-up · 35 acquisitions, 14 frames shown]
[im 2/35]
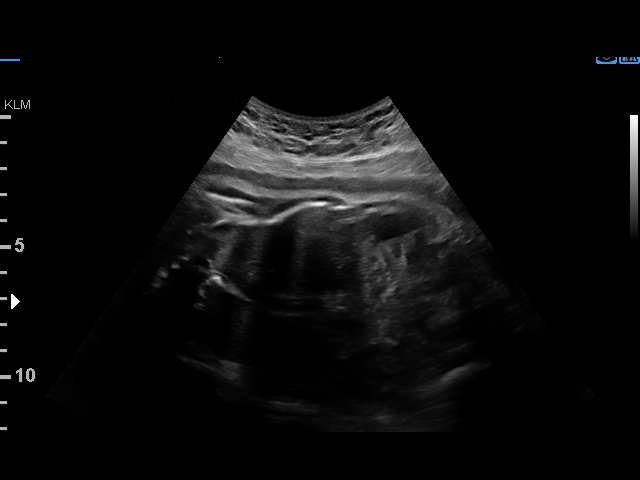
[im 4/35]
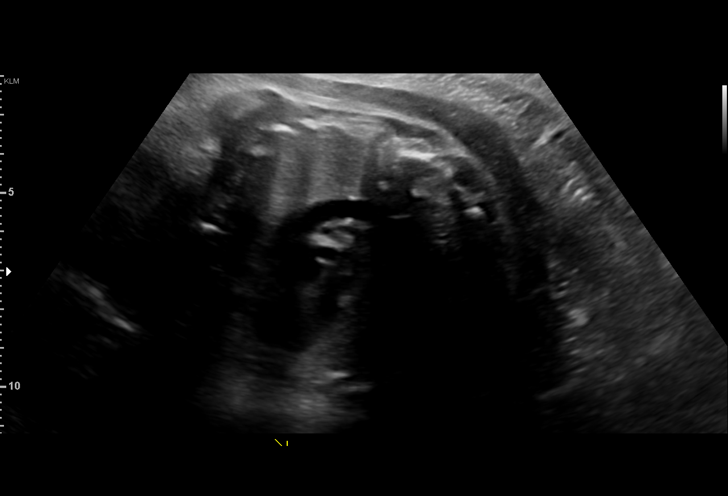
[im 7/35]
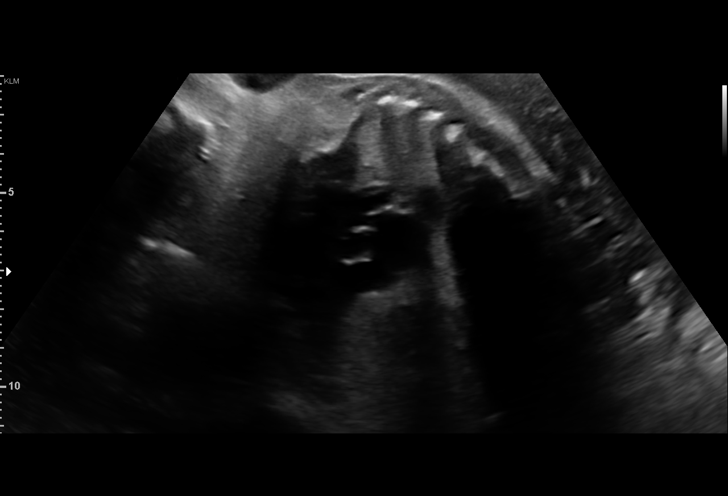
[im 9/35]
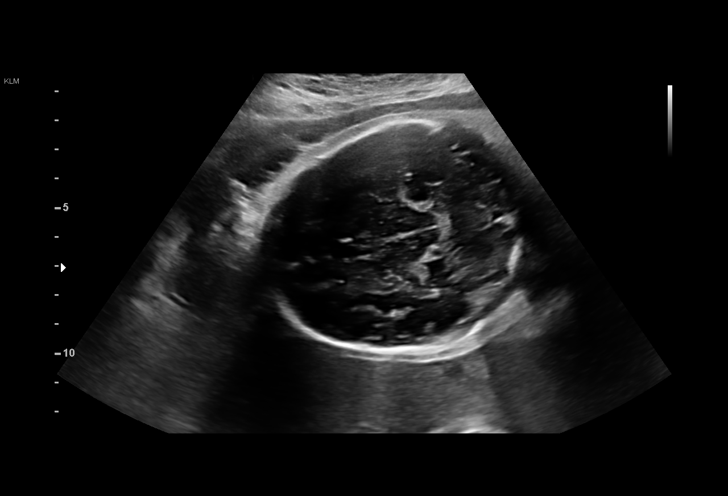
[im 12/35]
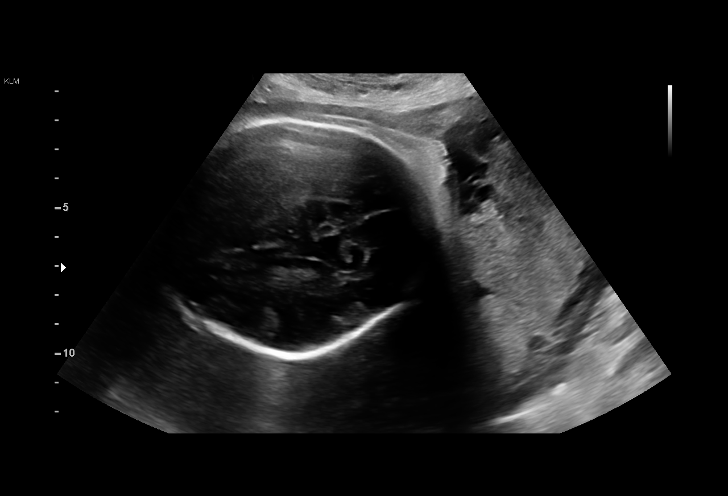
[im 14/35]
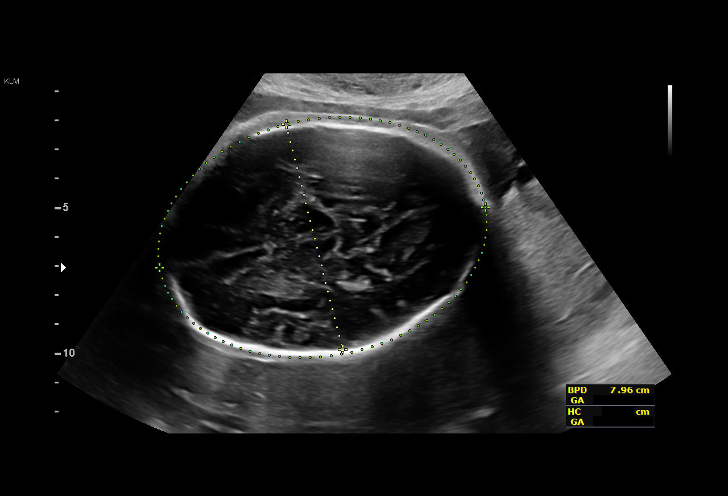
[im 17/35]
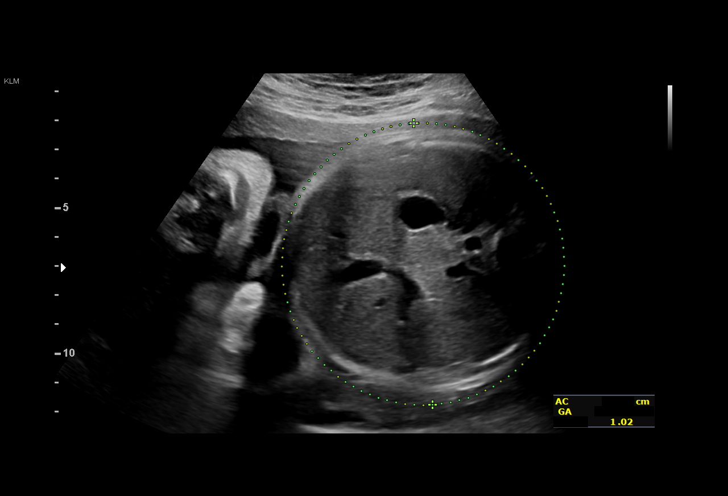
[im 19/35]
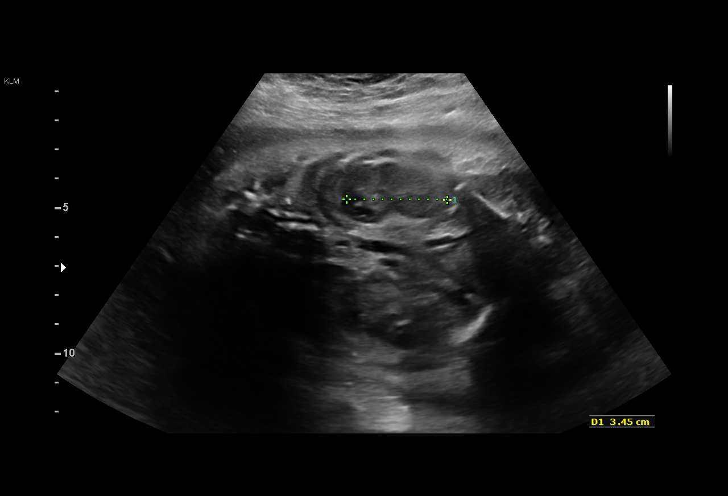
[im 22/35]
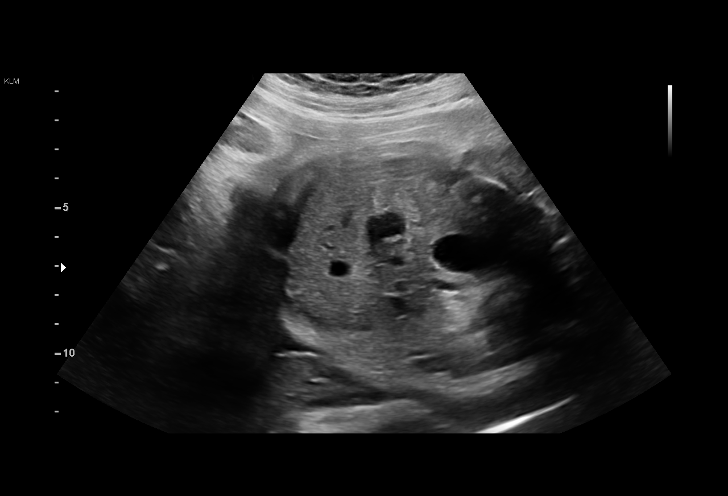
[im 24/35]
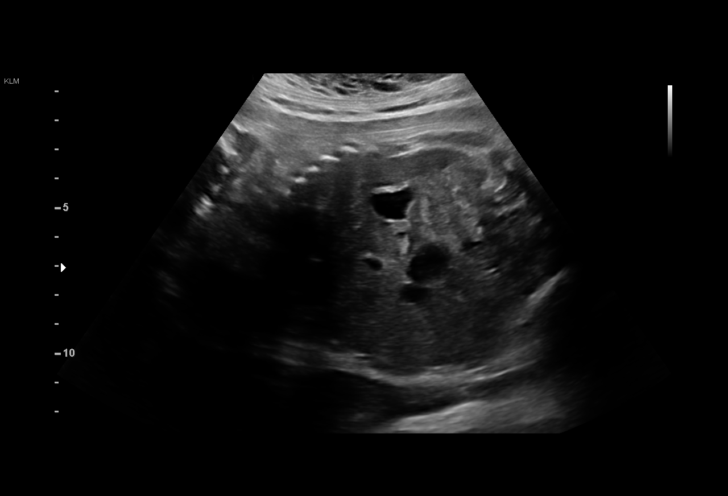
[im 27/35]
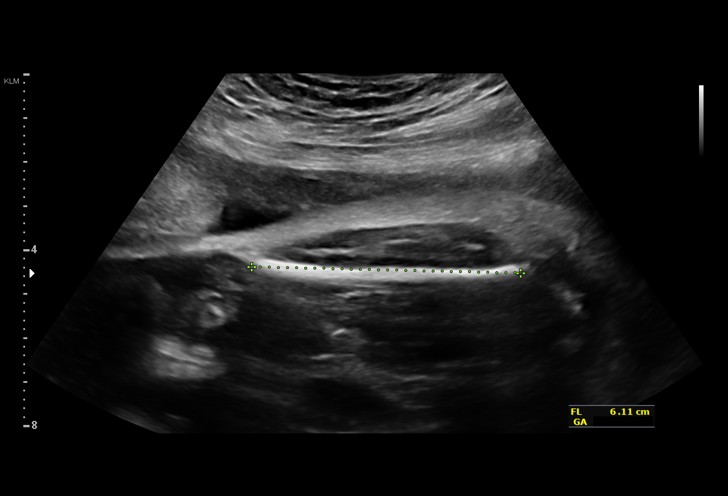
[im 29/35]
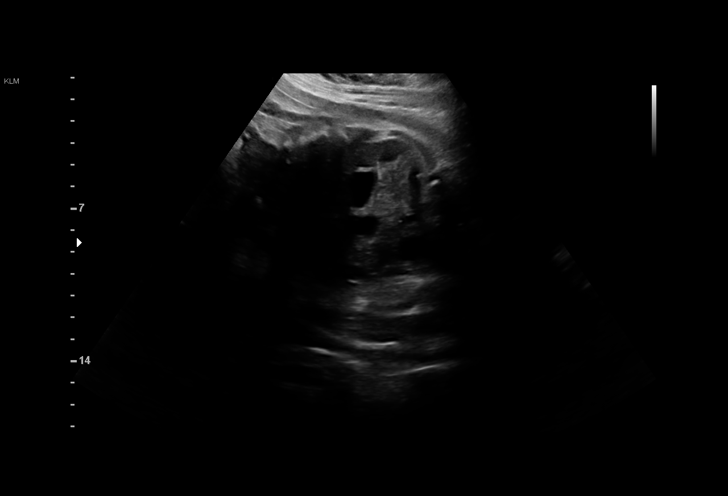
[im 32/35]
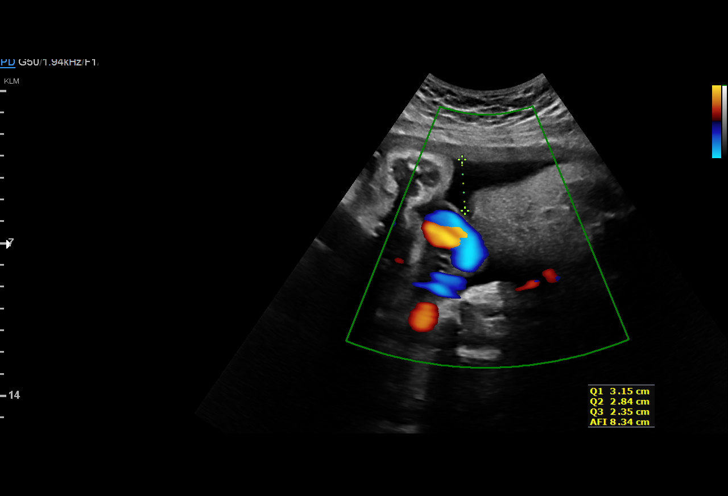
[im 35/35]
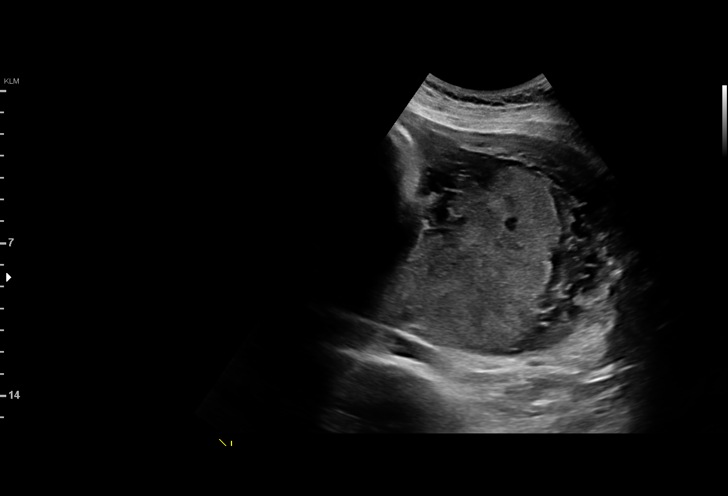

[14 of 28 positions shown; findings below may reference images not displayed]

MADHAVI CNM

1  GERDA DESMARAIS            [id number]       [id number]     [id number]
Indications

32 weeks gestation of pregnancy
Hypothyroid on replacement
Antenatal follow-up for nonvisualized fetal
anatomy
Oligohydramnios / Decreased amniotic fluid
volume
OB History

Gravidity:    1
Fetal Evaluation

Num Of Fetuses:     1
Fetal Heart         154
Rate(bpm):
Cardiac Activity:   Observed
Presentation:       Frank breech
Placenta:           Posterior, above cervical os
P. Cord Insertion:  Previously Visualized

Amniotic Fluid
AFI FV:      Subjectively within normal limits

AFI Sum(cm)     %Tile       Largest Pocket(cm)
10.43           20

RUQ(cm)       RLQ(cm)       LUQ(cm)        LLQ(cm)
3.15
Biometry

BPD:      79.6  mm     G. Age:  32w 0d         28  %    CI:        67.54   %   70 - 86
FL/HC:      20.3   %   19.1 -
HC:       310   mm     G. Age:  34w 4d         72  %    HC/AC:      1.03       0.96 -
AC:      299.7  mm     G. Age:  34w 0d         87  %    FL/BPD:     79.0   %   71 - 87
FL:       62.9  mm     G. Age:  32w 4d         41  %    FL/AC:      21.0   %   20 - 24

Est. FW:    1990  gm    4 lb 13 oz      76  %
Gestational Age

Clinical EDD:  30w 3d                                        EDD:   01/29/16
U/S Today:     33w 2d                                        EDD:   01/09/16
Best:          32w 3d    Det. By:   U/S  (10/26/15)          EDD:   01/15/16
Anatomy

Cranium:               Appears normal         Aortic Arch:            Previously seen
Cavum:                 Previously seen        Ductal Arch:            Appears normal
Ventricles:            Appears normal         Diaphragm:              Appears normal
Choroid Plexus:        Previously seen        Stomach:                Appears normal, left
sided
Cerebellum:            Previously seen        Abdomen:                Appears normal
Posterior Fossa:       Previously seen        Abdominal Wall:         Previously seen
Nuchal Fold:           Not applicable (>20    Cord Vessels:           Previously seen
wks GA)
Face:                  Orbits and profile     Kidneys:                Appear normal
previously seen
Lips:                  Previously seen        Bladder:                Appears normal
Thoracic:              Appears normal         Spine:                  Previously seen
Heart:                 Previously seen        Upper Extremities:      Not well visualized
RVOT:                  Previously seen        Lower Extremities:      Previously seen
LVOT:                  Previously seen

Other:  Fetus appears to be a female. Nasal bone previously visualized.
Technically difficult due to fetal position. Technically difficult due to
low amniotic fluid.
Cervix Uterus Adnexa

Cervix
Not visualized (advanced GA >46wks)
Impression

Single IUP at 32w 3d
Hypothyroidism
Interval growth is appropriate (76th %tile)
Normal amniotic fluid volume is noted (AFI 10.4 cm)
Recommendations

Follow-up ultrasounds as clinically indicated.

## 2018-10-29 ENCOUNTER — Other Ambulatory Visit: Payer: Self-pay

## 2018-10-29 ENCOUNTER — Ambulatory Visit (INDEPENDENT_AMBULATORY_CARE_PROVIDER_SITE_OTHER): Payer: Medicaid Other | Admitting: Student in an Organized Health Care Education/Training Program

## 2018-10-29 ENCOUNTER — Other Ambulatory Visit (HOSPITAL_COMMUNITY)
Admission: RE | Admit: 2018-10-29 | Discharge: 2018-10-29 | Disposition: A | Discharge status: Home / Self Care | Payer: Medicaid Other | Source: Ambulatory Visit | Attending: Family Medicine | Admitting: Family Medicine

## 2018-10-29 ENCOUNTER — Encounter: Payer: Self-pay | Admitting: Student in an Organized Health Care Education/Training Program

## 2018-10-29 VITALS — BP 104/60 | HR 75 | Wt 177.0 lb

## 2018-10-29 DIAGNOSIS — Z01411 Encounter for gynecological examination (general) (routine) with abnormal findings: Secondary | ICD-10-CM | POA: Insufficient documentation

## 2018-10-29 DIAGNOSIS — E039 Hypothyroidism, unspecified: Secondary | ICD-10-CM

## 2018-10-29 DIAGNOSIS — Z113 Encounter for screening for infections with a predominantly sexual mode of transmission: Secondary | ICD-10-CM | POA: Insufficient documentation

## 2018-10-29 DIAGNOSIS — J309 Allergic rhinitis, unspecified: Secondary | ICD-10-CM | POA: Diagnosis not present

## 2018-10-29 LAB — POCT WET PREP (WET MOUNT)
Clue Cells Wet Prep Whiff POC: NEGATIVE
Trichomonas Wet Prep HPF POC: ABSENT

## 2018-10-29 MED ORDER — CETIRIZINE HCL 10 MG PO TABS: 10.0000 mg | ORAL_TABLET | Freq: Every day | ORAL | 11 refills | Status: DC

## 2018-10-29 NOTE — Assessment & Plan Note (Signed)
Tested patient's TSH today Will contact patient with results and adjust meds as necessary Recommended patient attempt to get regular physical activity to help with symptoms

## 2018-10-29 NOTE — Patient Instructions (Addendum)
It was a pleasure to see you today!  To summarize our discussion for this visit:  Today we checked your thyroid hormone.  We will contact you with the results  For your sore throat we are going to attempt to treat with an allergy medication if this does not improve your symptoms please come back and see me  For your pelvic discomfort that has now resolved we are testing you for infection and again I will contact you with the results of those tests  Some additional health maintenance measures we should update are: Health Maintenance Due  Topic Date Due  . INFLUENZA VACCINE  08/28/2018  .   Please return to our clinic to see me in about 6 months.  Call the clinic at [phone] if your symptoms worsen or you have any concerns.   Thank you for allowing me to take part in your care,  Dr. Jamelle Rushinghelsey    Kegel Exercises  Kegel exercises can help strengthen your pelvic floor muscles. The pelvic floor is a group of muscles that support your rectum, small intestine, and bladder. In females, pelvic floor muscles also help support the womb (uterus). These muscles help you control the flow of urine and stool. Kegel exercises are painless and simple, and they do not require any equipment. Your provider may suggest Kegel exercises to:  Improve bladder and bowel control.  Improve sexual response.  Improve weak pelvic floor muscles after surgery to remove the uterus (hysterectomy) or pregnancy (females).  Improve weak pelvic floor muscles after prostate gland removal or surgery (males). Kegel exercises involve squeezing your pelvic floor muscles, which are the same muscles you squeeze when you try to stop the flow of urine or keep from passing gas. The exercises can be done while sitting, standing, or lying down, but it is best to vary your position. Exercises How to do Kegel exercises: 1. Squeeze your pelvic floor muscles tight. You should feel a tight lift in your rectal area. If you  are a female, you should also feel a tightness in your vaginal area. Keep your stomach, buttocks, and legs relaxed. 2. Hold the muscles tight for up to 10 seconds. 3. Breathe normally. 4. Relax your muscles. 5. Repeat as told by your health care provider. Repeat this exercise daily as told by your health care provider. Continue to do this exercise for at least 4-6 weeks, or for as long as told by your health care provider. You may be referred to a physical therapist who can help you learn more about how to do Kegel exercises. Depending on your condition, your health care provider may recommend:  Varying how long you squeeze your muscles.  Doing several sets of exercises every day.  Doing exercises for several weeks.  Making Kegel exercises a part of your regular exercise routine. This information is not intended to replace advice given to you by your health care provider. Make sure you discuss any questions you have with your health care provider. Document Released: 12/31/2011 Document Revised: 09/02/2017 Document Reviewed: 09/02/2017 Elsevier Patient Education  2020 [employer]   Allergic Rhinitis, Adult Allergic rhinitis is a reaction to allergens in the air. Allergens are tiny specks (particles) in the air that cause your body to have an allergic reaction. This condition cannot be passed from person to person (is not contagious). Allergic rhinitis cannot be cured, but it can be controlled. There are two types of allergic rhinitis:  Seasonal. This type is also called hay fever. It  happens only during certain times of the year.  Perennial. This type can happen at any time of the year. What are the causes? This condition may be caused by:  Pollen from grasses, trees, and weeds.  House dust mites.  Pet dander.  Mold. What are the signs or symptoms? Symptoms of this condition include:  Sneezing.  Runny or stuffy nose (nasal congestion).  A lot of mucus in the back of the  throat (postnasal drip).  Itchy nose.  Tearing of the eyes.  Trouble sleeping.  Being sleepy during day. How is this treated? There is no cure for this condition. You should avoid things that trigger your symptoms (allergens). Treatment can help to relieve symptoms. This may include:  Medicines that block allergy symptoms, such as antihistamines. These may be given as a shot, nasal spray, or pill.  Shots that are given until your body becomes less sensitive to the allergen (desensitization).  Stronger medicines, if all other treatments have not worked. Follow these instructions at home: Avoiding allergens   Find out what you are allergic to. Common allergens include smoke, dust, and pollen.  Avoid them if you can. These are some of the things that you can do to avoid allergens: ? Replace carpet with wood, tile, or vinyl flooring. Carpet can trap dander and dust. ? Clean any mold found in the home. ? Do not smoke. Do not allow smoking in your home. ? Change your heating and air conditioning filter at least once a month. ? During allergy season:  Keep windows closed as much as you can. If possible, use air conditioning when there is a lot of pollen in the air.  Use a special filter for allergies with your furnace and air conditioner.  Plan outdoor activities when pollen counts are lowest. This is usually during the early morning or evening hours.  If you do go outdoors when pollen count is high, wear a special mask for people with allergies.  When you come indoors, take a shower and change your clothes before sitting on furniture or bedding. General instructions  Do not use fans in your home.  Do not hang clothes outside to dry.  Wear sunglasses to keep pollen out of your eyes.  Wash your hands right away after you touch household pets.  Take over-the-counter and prescription medicines only as told by your doctor.  Keep all follow-up visits as told by your doctor.  This is important. Contact a doctor if:  You have a fever.  You have a cough that does not go away (is persistent).  You start to make whistling sounds when you breathe (wheeze).  Your symptoms do not get better with treatment.  You have thick fluid coming from your nose.  You start to have nosebleeds. Get help right away if:  Your tongue or your lips are swollen.  You have trouble breathing.  You feel dizzy or you feel like you are going to pass out (faint).  You have cold sweats. Summary  Allergic rhinitis is a reaction to allergens in the air.  This condition may be caused by allergens. These include pollen, dust mites, pet dander, and mold.  Symptoms include a runny, itchy nose, sneezing, or tearing eyes. You may also have trouble sleeping or feel sleepy during the day.  Treatment includes taking medicines and avoiding allergens. You may also get shots or take stronger medicines.  Get help if you have a fever or a cough that does not stop. Get  help right away if you are short of breath. This information is not intended to replace advice given to you by your health care provider. Make sure you discuss any questions you have with your health care provider. Document Released: 05/15/2010 Document Revised: 05/04/2018 Document Reviewed: 08/04/2017 Elsevier Patient Education  2020 [employer].

## 2018-10-29 NOTE — Progress Notes (Signed)
   Subjective:    Patient ID: Terri Glenn, female    DOB: 1995-02-28, 23 y.o.   MRN: [id number]  CC: thyroid follow up  HPI:  Patient presents today for follow up of her thyroid hormone.  Patient states that she has been adherent with her levothyroxine.  She believes that she has occasional mild edema of her bilateral hands which is symmetrical and resolved spontaneously.  She also believes that she has intermittent dry eyelids bilateral but no hair loss or fatigue.  Patient does have increased stress at work.  She is also concerned that she has a sore throat when she brushes her teeth and a mild cough when she wakes up in the morning but resolves throughout the day.  Patient denies smoke exposure and history of known allergies.  Denies rhinorrhea, allergic conjunctivitis, fever.  Patient states that she is currently asymptomatic of any pelvic symptoms but for the last month she had some itching and burning sensation of her vagina that ended approximately 1 week ago.  Patient states that she had increased thick clear/white discharge with no foul odor, dysuria, genital lesions.  Patient is sexually active with one female partner and they primarily use condoms for birth control but do have some occasions without protection.  Patient denies dyspareunia but does endorse having occasional pain with tampon use. Patient states that she is interested in testing for STDs at this time.  Patient would like the infectious tests with the pelvic exam today but declines to have the blood test for HIV, syphilis, HSV.  Smoking status reviewed   ROS: pertinent noted in the HPI   I have personally reviewed pertinent past medical history, surgical, family, and social history as appropriate.  Objective:  BP 104/60   Pulse 75   Wt 177 lb (80.3 kg)   LMP 10/12/2018 (Exact Date)   SpO2 98%   BMI 32.37 kg/m   Vitals and nursing note reviewed  General: NAD, pleasant, able to participate in exam Neck:  Negative for cervical or supraclavicular lymphadenitis, negative for tenderness to palpation.  Negative for thyromegaly, thyroid is mobile with swallowing. Pelvic: External exam revealed no lesions.  Difficulty with inserting speculum into the vaginal canal due to wall tightness.  Negative for foul odor or discharge.  Cervix.  Normal Extremities: no edema or cyanosis. 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 TSH  . POCT Wet Prep [facility])   Meds ordered this encounter  Medications  . cetirizine (ZYRTEC) 10 MG tablet    Sig: Take 1 tablet (10 mg total) by mouth daily.    Dispense:  30 tablet    Refill:  11   I independently examined pertinent imaging in relation to problem.  Doristine Mango, [facility] PGY-2

## 2018-10-29 NOTE — Assessment & Plan Note (Signed)
Symptoms suspicious of allergies due to unknown trigger, exam was negative for signs of infection -Prescribing Zyrtec today -Return if not improving

## 2018-10-29 NOTE — Assessment & Plan Note (Addendum)
Exam is abnormal for vaginismus Provided patient with counseling for Kegel exercises and avoidance of painful triggers

## 2018-10-30 LAB — TSH: TSH: 0.262 u[IU]/mL — ABNORMAL LOW (ref 0.450–4.500)

## 2018-11-02 ENCOUNTER — Other Ambulatory Visit: Payer: Self-pay | Admitting: Student in an Organized Health Care Education/Training Program

## 2018-11-02 DIAGNOSIS — Z113 Encounter for screening for infections with a predominantly sexual mode of transmission: Secondary | ICD-10-CM

## 2018-11-02 LAB — CERVICOVAGINAL ANCILLARY ONLY
Chlamydia: NEGATIVE
Neisseria Gonorrhea: NEGATIVE

## 2018-11-02 NOTE — Progress Notes (Signed)
Called patient to inform of test results for TSH being low.  Discussed options going forward with patient and she has elected to come in for further blood work.  We will test her free T4 on 10/7 and adjust her medication as needed based on those results. As patient was also wanting to obtain blood sample for further STD testing we will do this at the same time. -Entered in future order for 10/7 for free T4, HIV, RPR, HSV IgG. -Gonorrhea and Chlamydia still pending

## 2018-11-03 ENCOUNTER — Other Ambulatory Visit: Payer: Self-pay

## 2018-11-03 ENCOUNTER — Other Ambulatory Visit: Payer: Medicaid Other

## 2018-11-03 DIAGNOSIS — Z113 Encounter for screening for infections with a predominantly sexual mode of transmission: Secondary | ICD-10-CM

## 2018-11-05 LAB — RPR: RPR Ser Ql: NONREACTIVE

## 2018-11-05 LAB — HIV ANTIBODY (ROUTINE TESTING W REFLEX): HIV Screen 4th Generation wRfx: NONREACTIVE

## 2018-11-05 LAB — HSV(HERPES SIMPLEX VRS) I + II AB-IGM: HSVI/II Comb IgM: 1.2 Ratio — ABNORMAL HIGH (ref 0.00–0.90)

## 2018-11-05 LAB — HSV(HERPES SIMPLEX VRS) I + II AB-IGG
HSV 1 Glycoprotein G Ab, IgG: 13.5 index — ABNORMAL HIGH (ref 0.00–0.90)
HSV 2 IgG, Type Spec: <0.91 index (ref 0.00–0.90)

## 2018-11-05 LAB — T4, FREE: Free T4: 0.66 ng/dL — ABNORMAL LOW (ref 0.82–1.77)

## 2018-11-08 ENCOUNTER — Encounter: Payer: Self-pay | Admitting: Student in an Organized Health Care Education/Training Program

## 2018-11-09 ENCOUNTER — Other Ambulatory Visit: Payer: Self-pay | Admitting: Student in an Organized Health Care Education/Training Program

## 2018-11-09 MED ORDER — LEVOTHYROXINE SODIUM 175 MCG PO TABS: 175.0000 ug | ORAL_TABLET | Freq: Every day | ORAL | 0 refills | Status: DC

## 2018-11-09 NOTE — Telephone Encounter (Signed)
Called patient to discuss results.  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  TSH and T4 decreased so will trial 114mcg thyroid and recheck levels in about 4 weeks.

## 2018-11-18 ENCOUNTER — Encounter: Payer: Self-pay | Admitting: Student in an Organized Health Care Education/Training Program

## 2018-12-12 ENCOUNTER — Other Ambulatory Visit: Payer: Self-pay | Admitting: Student in an Organized Health Care Education/Training Program

## 2019-04-01 ENCOUNTER — Other Ambulatory Visit: Payer: Self-pay | Admitting: Student in an Organized Health Care Education/Training Program

## 2019-04-13 ENCOUNTER — Other Ambulatory Visit: Payer: Self-pay | Admitting: Student in an Organized Health Care Education/Training Program

## 2019-05-30 ENCOUNTER — Encounter: Payer: Self-pay | Admitting: Student in an Organized Health Care Education/Training Program

## 2019-05-30 ENCOUNTER — Ambulatory Visit (INDEPENDENT_AMBULATORY_CARE_PROVIDER_SITE_OTHER): Payer: Medicaid Other | Admitting: Student in an Organized Health Care Education/Training Program

## 2019-05-30 ENCOUNTER — Other Ambulatory Visit: Payer: Self-pay

## 2019-05-30 VITALS — BP 102/76 | HR 61 | Ht 62.0 in | Wt 163.2 lb

## 2019-05-30 DIAGNOSIS — Z3200 Encounter for pregnancy test, result unknown: Secondary | ICD-10-CM | POA: Insufficient documentation

## 2019-05-30 DIAGNOSIS — Z3009 Encounter for other general counseling and advice on contraception: Secondary | ICD-10-CM

## 2019-05-30 DIAGNOSIS — E039 Hypothyroidism, unspecified: Secondary | ICD-10-CM

## 2019-05-30 DIAGNOSIS — Z789 Other specified health status: Secondary | ICD-10-CM

## 2019-05-30 LAB — POCT GLYCOSYLATED HEMOGLOBIN (HGB A1C): Hemoglobin A1C: 5.5 % (ref 4.0–5.6)

## 2019-05-30 LAB — POCT URINE PREGNANCY: Preg Test, Ur: NEGATIVE

## 2019-05-30 NOTE — Assessment & Plan Note (Signed)
Patient exhibiting symptoms which are suspicious for overtreatment with levothyroxine. Rechecking thyroid labs today as well as screening for diabetes and pregnancy. We will adjust medications as needed

## 2019-05-30 NOTE — Progress Notes (Signed)
    SUBJECTIVE:   CHIEF COMPLAINT / HPI: thyroid check  Patient has been adherent with levothyroxine since our last appointment but did not follow up in timely manner as we had previously discussed. She states that she has been experiencing symptoms which have concerned her.  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  Patient's last menstrual cycle was approximately a month ago and she is sexually active with 1 partner using condoms.  Patient was 177 pounds at visit in October and is 163 pounds today.  Endorses poor and non-nutritious diet.  Denies any recent illnesses.  PERTINENT  PMH / PSH: Hypothyroidism  OBJECTIVE:   BP 102/76   Pulse 61   Ht 5\' 2"  (1.575 m)   Wt 163 lb 3.2 oz (74 kg)   LMP 05/05/2019 (Approximate)   SpO2 96%   BMI 29.85 kg/m   General: NAD, pleasant, able to participate in exam HEENT: negative for thyromegaly or masses. Non-tender cervical exam.  Cardiac: RRR, normal heart sounds, no murmurs. 2+ radial and PT pulses bilaterally Respiratory: CTAB, normal effort, No wheezes, rales or rhonchi Abdomen: soft, nontender, nondistended, no hepatic or splenomegaly, +BS Extremities: no edema or cyanosis. WWP. Skin: warm and moist, no rashes noted Neuro: alert and oriented x4, no focal deficits Psych: Normal affect and mood  ASSESSMENT/PLAN:   Hypothyroid Patient exhibiting symptoms which are suspicious for overtreatment with levothyroxine. Rechecking thyroid labs today as well as screening for diabetes and pregnancy. We will adjust medications as needed  Encounter for counseling regarding contraception Discussed failure rate of condoms and other options for reliable birth control since pregnancy is not desired at this time. Urine pregnancy test today  Unknown status of immunity to COVID-19 virus Counseled patient on Covid vaccine and  provided with resources for more information on obtaining the immunization     07/05/2019, DO [facility]

## 2019-05-30 NOTE — Assessment & Plan Note (Signed)
Discussed failure rate of condoms and other options for reliable birth control since pregnancy is not desired at this time. Urine pregnancy test today

## 2019-05-30 NOTE — Patient Instructions (Signed)
It was a pleasure to see you today!  To summarize our discussion for this visit:  We are going to check your thyroid hormones as well as screen for diabetes and pregnancy. I will call you with the results.   Some additional health maintenance measures we should update are: Health Maintenance Due  Topic Date Due  . COVID-19 Vaccine (1) Never done  .   Please return to our clinic to see me as needed based on your labs today.  Call the clinic at [phone] if your symptoms worsen or you have any concerns.   Thank you for allowing me to take part in your care,  Dr. Doristine Mango  COVID-19 Vaccine Information can be found at: [email or web address] For questions related to vaccine distribution or appointments, please email vaccine@ [email or web address] or call [phone].    Contraception Choices Contraception, also called birth control, means things to use or ways to try not to get pregnant. Hormonal birth control This kind of birth control uses hormones. Here are some types of hormonal birth control:  A tube that is put under skin of the arm (implant). The tube can stay in for as long as 3 years.  Shots to get every 3 months (injections).  Pills to take every day (birth control pills).  A patch to change 1 time each week for 3 weeks (birth control patch). After that, the patch is taken off for 1 week.  A ring to put in the vagina. The ring is left in for 3 weeks. Then it is taken out of the vagina for 1 week. Then a new ring is put in.  Pills to take after unprotected sex (emergency birth control pills). Barrier birth control Here are some types of barrier birth control:  A thin covering that is put on the penis before sex (female condom). The covering is thrown away after sex.  A soft, loose covering that is put in the vagina before sex (female condom). The covering is thrown away after sex.  A rubber bowl that sits over  the cervix (diaphragm). The bowl must be made for you. The bowl is put into the vagina before sex. The bowl is left in for 6-8 hours after sex. It is taken out within 24 hours.  A small, soft cup that fits over the cervix (cervical cap). The cup must be made for you. The cup can be left in for 6-8 hours after sex. It is taken out within 48 hours.  A sponge that is put into the vagina before sex. It must be left in for at least 6 hours after sex. It must be taken out within 30 hours. Then it is thrown away.  A chemical that kills or stops sperm from getting into the uterus (spermicide). It may be a pill, cream, jelly, or foam to put in the vagina. The chemical should be used at least 10-15 minutes before sex. IUD (intrauterine) birth control An IUD is a small, T-shaped piece of plastic. It is put inside the uterus. There are two kinds:  Hormone IUD. This kind can stay in for 3-5 years.  Copper IUD. This kind can stay in for 10 years. Permanent birth control Here are some types of permanent birth control:  Surgery to block the fallopian tubes.  Having an insert put into each fallopian tube.  Surgery to tie off the tubes that carry sperm (vasectomy). Natural planning birth control Here are some types of natural planning birth control:  Not having sex on  the days the woman could get pregnant.  Using a calendar: ? To keep track of the length of each period. ? To find out what days pregnancy can happen. ? To plan to not have sex on days when pregnancy can happen.  Watching for symptoms of ovulation and not having sex during ovulation. One way the woman can check for ovulation is to check her temperature.  Waiting to have sex until after ovulation. Summary  Contraception, also called birth control, means things to use or ways to try not to get pregnant.  Hormonal methods of birth control include implants, injections, pills, patches, vaginal rings, and emergency birth control pills.   Barrier methods of birth control can include female condoms, female condoms, diaphragms, cervical caps, sponges, and spermicides.  There are two types of IUD (intrauterine device) birth control. An IUD can be put in a woman's uterus to prevent pregnancy for 3-5 years.  Permanent sterilization can be done through a procedure for males, females, or both.  Natural planning methods involve not having sex on the days when the woman could get pregnant. This information is not intended to replace advice given to you by your health care provider. Make sure you discuss any questions you have with your health care provider. Document Revised: 05/05/2018 Document Reviewed: 01/24/2016 Elsevier Patient Education  2020 [employer].

## 2019-05-30 NOTE — Assessment & Plan Note (Signed)
Counseled patient on Covid vaccine and provided with resources for more information on obtaining the immunization

## 2019-05-31 LAB — TSH+FREE T4
Free T4: 1.94 ng/dL — ABNORMAL HIGH (ref 0.82–1.77)
TSH: 3.07 u[IU]/mL (ref 0.450–4.500)

## 2019-06-09 ENCOUNTER — Telehealth: Payer: Self-pay

## 2019-06-09 NOTE — Telephone Encounter (Signed)
Patient calls nurse line requesting lab results from 5/3. Please advise.

## 2019-06-30 ENCOUNTER — Encounter: Payer: Self-pay | Admitting: Student in an Organized Health Care Education/Training Program

## 2019-07-06 ENCOUNTER — Other Ambulatory Visit: Payer: Self-pay | Admitting: Student in an Organized Health Care Education/Training Program

## 2019-07-15 ENCOUNTER — Encounter: Payer: Self-pay | Admitting: Family Medicine

## 2019-07-15 ENCOUNTER — Other Ambulatory Visit (HOSPITAL_COMMUNITY)
Admission: RE | Admit: 2019-07-15 | Discharge: 2019-07-15 | Disposition: A | Discharge status: Home / Self Care | Payer: Medicaid Other | Source: Ambulatory Visit | Attending: Family Medicine | Admitting: Family Medicine

## 2019-07-15 ENCOUNTER — Ambulatory Visit (INDEPENDENT_AMBULATORY_CARE_PROVIDER_SITE_OTHER): Payer: Medicaid Other | Admitting: Family Medicine

## 2019-07-15 ENCOUNTER — Other Ambulatory Visit: Payer: Self-pay

## 2019-07-15 VITALS — BP 102/68 | HR 77 | Wt 166.4 lb

## 2019-07-15 DIAGNOSIS — Z3200 Encounter for pregnancy test, result unknown: Secondary | ICD-10-CM | POA: Diagnosis not present

## 2019-07-15 DIAGNOSIS — N898 Other specified noninflammatory disorders of vagina: Secondary | ICD-10-CM | POA: Insufficient documentation

## 2019-07-15 DIAGNOSIS — Z30011 Encounter for initial prescription of contraceptive pills: Secondary | ICD-10-CM | POA: Insufficient documentation

## 2019-07-15 LAB — POCT WET PREP (WET MOUNT)
Clue Cells Wet Prep Whiff POC: NEGATIVE
Trichomonas Wet Prep HPF POC: ABSENT

## 2019-07-15 LAB — POCT URINE PREGNANCY: Preg Test, Ur: NEGATIVE

## 2019-07-15 MED ORDER — NORGESTIMATE-ETH ESTRADIOL 0.25-35 MG-MCG PO TABS: 1.0000 | ORAL_TABLET | Freq: Every day | ORAL | 6 refills | Status: DC

## 2019-07-15 NOTE — Assessment & Plan Note (Addendum)
Patient is desiring to not be pregnant and has started sex activity with a female as of 1 week ago.  Would like to restart her prior Ortho-Cyclen.  First day last menstrual period was 6/4.  Pregnancy test negative, reordered Ortho-Cyclen and patient was instructed to start taking at first day of next menses.  Instructed to use condoms until the first 7 days of taking the pills.  Also counseled further that these pills do not prevent STD transmission that she should consider condoms or barrier protection.

## 2019-07-15 NOTE — Patient Instructions (Addendum)
You will see the results of the test that happened today in your MyChart.  If something looks like it needs to be treated I will order the medicine and let you know via MyChart message.  If it appears that something needs to be treated and you have not heard from myself or another physician about it, please send [place] a message because I do not want you to be concerned that you are not getting the care you need.  Your pregnancy test was negative today, I have sent in the birth control pills to the pharmacy that you asked for.  Please continue to use condoms until the first day of your next menses at which point you will start taking the birth control pills.  Dr. Parke Simmers

## 2019-07-15 NOTE — Progress Notes (Signed)
° ° °  SUBJECTIVE:   CHIEF COMPLAINT / HPI: Vaginal discharge and contraception  Vaginal discharge Patient with increase in discharge, does also have a new sexual partner since 1 month ago.  Would like STD testing.  Encounter for pregnancy test, result unknown Patient is desiring to not be pregnant and has started sex activity with a female as of 1 week ago.  Would like to restart her prior Ortho-Cyclen.  First day last menstrual period was 6/4.  Pregnancy test negative, reordered Ortho-Cyclen and patient was instructed to start taking at first day of next menses.  Instructed to use condoms until the first 7 days of taking the pills.  Also counseled further that these pills do not prevent STD transmission that she should consider condoms or barrier protection.   PERTINENT  PMH / PSH:   OBJECTIVE:   BP 102/68    Pulse 77    Wt 166 lb 6.4 oz (75.5 kg)    LMP 07/01/2019    SpO2 97%    BMI 30.43 kg/m   General: Alert and pleasant, no medical distress GU exam*performed entirely with CMA Sheri in the room*no external or internally visualized pathologic lesions, no significant pathological discharge or bleeding.  Visualized cervix was normal in appearance.  No pathologic pain noted during exam.  ASSESSMENT/PLAN:   Vaginal discharge Patient with increase in discharge, does also have a new sexual partner since 1 month ago.  Would like STD testing.  Wet prep and GC/C ordered  Encounter for pregnancy test, result unknown Patient is desiring to not be pregnant and has started sex activity with a female as of 1 week ago.  Would like to restart her prior Ortho-Cyclen.  First day last menstrual period was 6/4.  Pregnancy test negative, reordered Ortho-Cyclen and patient was instructed to start taking at first day of next menses.  Instructed to use condoms until the first 7 days of taking the pills.  Also counseled further that these pills do not prevent STD transmission that she should consider condoms or  barrier protection.  Encounter for initial prescription of contraceptive pills Will restart on Ortho-Cyclen, will start at first day of next menses cycle.  Advised to use condoms until 1 week after pill start and to continues in condoms or other barrier protection for STD transmission prevention     Marthenia Rolling, DO [facility]

## 2019-07-15 NOTE — Assessment & Plan Note (Signed)
Will restart on Ortho-Cyclen, will start at first day of next menses cycle.  Advised to use condoms until 1 week after pill start and to continues in condoms or other barrier protection for STD transmission prevention

## 2019-07-15 NOTE — Assessment & Plan Note (Signed)
Patient with increase in discharge, does also have a new sexual partner since 1 month ago.  Would like STD testing.  Wet prep and GC/C ordered

## 2019-07-18 LAB — CERVICOVAGINAL ANCILLARY ONLY
Chlamydia: NEGATIVE
Comment: NEGATIVE
Comment: NORMAL
Neisseria Gonorrhea: NEGATIVE

## 2019-11-28 ENCOUNTER — Ambulatory Visit: Payer: Medicaid Other

## 2019-12-01 ENCOUNTER — Other Ambulatory Visit (HOSPITAL_COMMUNITY)
Admission: RE | Admit: 2019-12-01 | Discharge: 2019-12-01 | Disposition: A | Discharge status: Home / Self Care | Payer: Medicaid Other | Source: Ambulatory Visit | Attending: Family Medicine | Admitting: Family Medicine

## 2019-12-01 ENCOUNTER — Ambulatory Visit (INDEPENDENT_AMBULATORY_CARE_PROVIDER_SITE_OTHER): Payer: Medicaid Other | Admitting: Family Medicine

## 2019-12-01 ENCOUNTER — Other Ambulatory Visit: Payer: Self-pay

## 2019-12-01 VITALS — BP 110/66 | HR 69 | Wt 160.2 lb

## 2019-12-01 DIAGNOSIS — N898 Other specified noninflammatory disorders of vagina: Secondary | ICD-10-CM

## 2019-12-01 DIAGNOSIS — E039 Hypothyroidism, unspecified: Secondary | ICD-10-CM

## 2019-12-01 LAB — POCT WET PREP (WET MOUNT)
Clue Cells Wet Prep Whiff POC: NEGATIVE
Trichomonas Wet Prep HPF POC: ABSENT

## 2019-12-01 MED ORDER — FLUCONAZOLE 150 MG PO TABS: 150.0000 mg | ORAL_TABLET | ORAL | 0 refills | Status: DC

## 2019-12-01 NOTE — Assessment & Plan Note (Addendum)
Recommended follow-up with PCP in the next 1-2 months per their previous recommendation.

## 2019-12-01 NOTE — Patient Instructions (Signed)
It was great to see you today.  I was in your MyChart message when your wet prep results.  The gonorrhea and chlamydia will take a few days to come back.  Please avoid too many tight clothing or changing your soaps too frequently.  Also make sure that you are drinking plenty of water throughout the day.   Follow-up if this does not improve or sooner if worsening.

## 2019-12-01 NOTE — Assessment & Plan Note (Addendum)
Presentation and pelvic exam mildly consistent with yeast vaginitis, however wet prep normal.  Additionally discussed possibility of physiologic discharge.  Will send in Diflucan and assess for symptomatic improvement. Gc/Ch sent and pending.  Encouraged maintaining hydration, avoiding tight clothing, using nonfragranced soaps, and regular condom use.

## 2019-12-01 NOTE — Progress Notes (Signed)
    SUBJECTIVE:   CHIEF COMPLAINT / HPI: Vaginal discharge  Terri Glenn is a 24 year old female presenting for evaluation of vaginal discharge.  Reports increased vaginal discharge starting last week that was white and chunky, initially with vaginal itching/irritation however now has resolved.  She also noted that her vaginal tissue seemed a little dry during this time which was unusual for her.  She recently changed laundry mats and used a different body soap.  Denies any associated dysuria, abdominal pain, rash, fever, back pain, hematuria.  Would like to be checked for STDs today.  She is sexually active in a monogamous relationship, takes her OCPs on a consistent basis.  LMP on 10/10.  She has had a similar episode in the past, not sure what it was diagnosed as.  PERTINENT  PMH / PSH: Consistent OCP use, hypothyroidism  OBJECTIVE:   BP 110/66   Pulse 69   Wt 160 lb 3.2 oz (72.7 kg)   LMP 11/06/2019   SpO2 98%   BMI 29.30 kg/m   General: Alert, NAD HEENT: NCAT, MMM Lungs: No increased WOB  Abdomen: soft, non-tender Pelvic exam: VULVA: normal appearing vulva with no masses, tenderness or lesions, VAGINA: normal appearing vagina with normal color, white slightly thicker discharge present, no lesions, CERVIX: normal appearing cervix without lesions, no cerivcal motion tenderness, exam chaperoned by CMA. Ext: Warm, dry  ASSESSMENT/PLAN:   Vaginal discharge Presentation and pelvic exam mildly consistent with yeast vaginitis, however wet prep normal.  Additionally discussed possibility of physiologic discharge.  Will send in Diflucan and assess for symptomatic improvement. Gc/Ch sent and pending.  Encouraged maintaining hydration, avoiding tight clothing, using nonfragranced soaps, and regular condom use.   Hypothyroid Recommended follow-up with PCP in the next 1-2 months per their previous recommendation.    Follow-up if not improving or sooner if worsening, otherwise follow-up in the next  1-2 months as above.  Allayne Stack, DO [facility]

## 2019-12-02 LAB — CERVICOVAGINAL ANCILLARY ONLY
Chlamydia: NEGATIVE
Comment: NEGATIVE
Comment: NORMAL
Neisseria Gonorrhea: NEGATIVE

## 2020-02-04 ENCOUNTER — Other Ambulatory Visit: Payer: Self-pay | Admitting: Student in an Organized Health Care Education/Training Program

## 2020-02-06 NOTE — Telephone Encounter (Signed)
Patient will need appointment and repeat TSH drawn for further refills on thyroid medication. 30 day supply sent to pharmacy.

## 2020-03-05 ENCOUNTER — Other Ambulatory Visit: Payer: Self-pay | Admitting: Student in an Organized Health Care Education/Training Program

## 2020-05-10 ENCOUNTER — Other Ambulatory Visit: Payer: Self-pay | Admitting: Student in an Organized Health Care Education/Training Program

## 2020-06-21 ENCOUNTER — Other Ambulatory Visit: Payer: Self-pay | Admitting: Student in an Organized Health Care Education/Training Program

## 2020-06-23 MED ORDER — LEVOTHYROXINE SODIUM 175 MCG PO TABS: 175.0000 ug | ORAL_TABLET | Freq: Every day | ORAL | 0 refills | Status: DC

## 2020-08-09 ENCOUNTER — Other Ambulatory Visit: Payer: Self-pay

## 2020-08-09 MED ORDER — LEVOTHYROXINE SODIUM 175 MCG PO TABS: 175.0000 ug | ORAL_TABLET | Freq: Every day | ORAL | 0 refills | Status: DC

## 2020-08-13 ENCOUNTER — Other Ambulatory Visit (HOSPITAL_COMMUNITY)
Admission: RE | Admit: 2020-08-13 | Discharge: 2020-08-13 | Disposition: A | Discharge status: Home / Self Care | Payer: Medicaid Other | Source: Ambulatory Visit | Attending: Family Medicine | Admitting: Family Medicine

## 2020-08-13 ENCOUNTER — Ambulatory Visit (INDEPENDENT_AMBULATORY_CARE_PROVIDER_SITE_OTHER): Payer: Medicaid Other | Admitting: Family Medicine

## 2020-08-13 ENCOUNTER — Other Ambulatory Visit: Payer: Self-pay

## 2020-08-13 VITALS — BP 117/60 | HR 65 | Ht 62.0 in | Wt 161.4 lb

## 2020-08-13 DIAGNOSIS — N898 Other specified noninflammatory disorders of vagina: Secondary | ICD-10-CM | POA: Insufficient documentation

## 2020-08-13 DIAGNOSIS — Z113 Encounter for screening for infections with a predominantly sexual mode of transmission: Secondary | ICD-10-CM | POA: Diagnosis not present

## 2020-08-13 LAB — POCT WET PREP (WET MOUNT)
Clue Cells Wet Prep Whiff POC: NEGATIVE
Trichomonas Wet Prep HPF POC: ABSENT

## 2020-08-13 NOTE — Progress Notes (Signed)
    SUBJECTIVE:   CHIEF COMPLAINT / HPI:   Patient presents for STD screening. She has been experiencing thick, white discharge for about a week duration. Denies fever, chills, associated odor or rash. She is sexually active with one female partner. Denies presence of abdominal or pelvic pain. Denies any other concerns at this time. Currently menstruating.    OBJECTIVE:   BP 117/60   Pulse 65   Ht 5\' 2"  (1.575 m)   Wt 161 lb 6.4 oz (73.2 kg)   LMP 07/12/2020   SpO2 98%   BMI 29.52 kg/m   General: Patient well-appearing, in no acute distress. CV: RRR, no murmurs or gallops auscultated Resp: CTAB Abodmen: nontender, nondistended, presence of bowel sounds GU: no vaginal or cervical discharge noted, no odor noted, bleeding present, no adnexal masses noted  GU exam performed in the presence of chaperone.   ASSESSMENT/PLAN:   Screening examination for STD (sexually transmitted disease) -STD screening pending including wet prep, GC/Chalmydia, HIV and RPR testing. Will notify patient of any abnormal results -encouraged to continue to use protection with each sexual encounter -f/u as appropriate       Allesha Aronoff 07/14/2020, DO [facility]

## 2020-08-13 NOTE — Assessment & Plan Note (Signed)
-  STD screening pending including wet prep, GC/Chalmydia, HIV and RPR testing. Will notify patient of any abnormal results -encouraged to continue to use protection with each sexual encounter -f/u as appropriate

## 2020-08-13 NOTE — Patient Instructions (Signed)
It was great seeing you today!  Today we completed testing since you were having vaginal discharge. I will let you know of any abnormal results. Please use protection at all times.  Please follow up at your next scheduled appointment as needed, if anything arises between now and then, please don't hesitate to contact our office.   Thank you for allowing [place] to be a part of your medical care!  Thank you, Dr. Robyne Peers

## 2020-08-14 LAB — CERVICOVAGINAL ANCILLARY ONLY
Bacterial Vaginitis (gardnerella): NEGATIVE
Chlamydia: NEGATIVE
Comment: NEGATIVE
Comment: NEGATIVE
Comment: NEGATIVE
Comment: NORMAL
Neisseria Gonorrhea: NEGATIVE
Trichomonas: NEGATIVE

## 2020-08-14 LAB — HIV ANTIBODY (ROUTINE TESTING W REFLEX): HIV Screen 4th Generation wRfx: NONREACTIVE

## 2020-08-14 LAB — RPR: RPR Ser Ql: NONREACTIVE

## 2020-09-06 ENCOUNTER — Ambulatory Visit (INDEPENDENT_AMBULATORY_CARE_PROVIDER_SITE_OTHER): Payer: Medicaid Other | Admitting: Student

## 2020-09-06 ENCOUNTER — Encounter: Payer: Self-pay | Admitting: Student

## 2020-09-06 ENCOUNTER — Other Ambulatory Visit: Payer: Self-pay

## 2020-09-06 VITALS — BP 98/60 | HR 71 | Ht 62.0 in | Wt 163.2 lb

## 2020-09-06 DIAGNOSIS — Z1159 Encounter for screening for other viral diseases: Secondary | ICD-10-CM | POA: Diagnosis not present

## 2020-09-06 DIAGNOSIS — E039 Hypothyroidism, unspecified: Secondary | ICD-10-CM | POA: Diagnosis not present

## 2020-09-06 MED ORDER — LEVOTHYROXINE SODIUM 175 MCG PO TABS: 175.0000 ug | ORAL_TABLET | Freq: Every day | ORAL | 0 refills | Status: DC

## 2020-09-06 NOTE — Patient Instructions (Signed)
It was great to see you! Thank you for allowing me to participate in your care!  I recommend that you always bring your medications to each appointment as this makes it easy to ensure you are on the correct medications and helps [place] not miss when refills are needed.  Our plans for today:  -We will check your thyroid level today. -Continue to take your levothyroxine as prescribed -You can use sunscreen on your fingers to prevent more discoloration would like -Please return in about 2 weeks for a follow-up to discuss your labs, skin discoloration, and menstrual cycle cramps  If your labs are abnormal, I will reach out to you before the 2 week follow-up   Take care and seek immediate care sooner if you develop any concerns.   Dr. Erick Alley, DO [facility]

## 2020-09-06 NOTE — Progress Notes (Addendum)
    SUBJECTIVE:   CHIEF COMPLAINT / HPI: Thyroid check, discoloration of fingers  PERTINENT  PMH / PSH: Hypothyroidism  Hypothyroidism Forgets to take levothyroxine about once a week and she feels a little light headed and like she could loose her balance, nauseous. Has never passed out. Denies palpitations, CP, SOB, BM issues. Has skin changes, loosing some hair.  Sometimes feels she needs to pee but not much comes out, no burning, no increasaed freq. No abdominal pain.   Hypopigmentation of fingers Skin changing color for less than 6 months. Started on 3rd finger of right hand, spread to index finger. See picture. Some  dry skin  Headaches HA for a couple days this past week. Hasn't taken anything. Right sided, throbbing.  No photophobia or sensitivity to sound.  Not debilitating, able to carry out every day functions  OBJECTIVE:   BP 98/60   Pulse 71   Ht 5\' 2"  (1.575 m)   Wt 163 lb 3.2 oz (74 kg)   LMP 08/11/2020   SpO2 97%   BMI 29.85 kg/m    General: NAD, pleasant, able to participate in exam Cardiac: RRR, no murmurs. Respiratory: CTAB, normal effort, No wheezes, rales or rhonchi Abdomen: Bowel sounds present, nontender, nondistended, no hepatosplenomegaly. Skin: Hypopigmentation around the cuticles of the second and third fingers of right hand, see photo in media tab Neuro: alert, no obvious focal deficits Psych: Normal affect and mood  ASSESSMENT/PLAN:  Hypothyroidism -TSH today -Continue 175 mcg levothyroxine daily -Keep diary of episodes of lightheadedness/dizziness to see if truly coordinates with missed doses of medication or continues to occur if she takes her levothyroxine daily   Hypopigmentation of fingers Patient had her nails done during the visit but stated this is the first time she has gotten her nails done since a year and a half ago.  I do not think her skin discoloration is associated with getting her nails done.  Consider vitiligo as a diagnosis.   If her hypothyroidism is autoimmune related, this could support the diagnosis of another autoimmune disorder such as vitiligo. -Continue to monitor -Use sunscreen on hands to prevent further contrast in skin tone if desired  Headaches As headaches are not severe, do not sound migraine in nature, patient is advised to take ibuprofen as needed -Ibuprofen as needed -We will follow-up on headaches at next visit  Patient will return in a few weeks to follow-up on the issues stated above and to discuss increased cramping during her menstrual cycle  Dr. 08/13/2020, DO [facility]

## 2020-09-07 LAB — HCV INTERPRETATION

## 2020-09-07 LAB — HCV AB W REFLEX TO QUANT PCR: HCV Ab: <0.1 s/co ratio (ref 0.0–0.9)

## 2020-09-07 LAB — TSH: TSH: 2.71 u[IU]/mL (ref 0.450–4.500)

## 2020-09-27 ENCOUNTER — Encounter: Payer: Self-pay | Admitting: Student

## 2020-09-28 ENCOUNTER — Encounter (HOSPITAL_COMMUNITY): Payer: Self-pay | Admitting: Emergency Medicine

## 2020-09-28 ENCOUNTER — Other Ambulatory Visit: Payer: Self-pay | Admitting: Student

## 2020-09-28 ENCOUNTER — Other Ambulatory Visit: Payer: Self-pay

## 2020-09-28 ENCOUNTER — Ambulatory Visit (HOSPITAL_COMMUNITY)
Admission: EM | Admit: 2020-09-28 | Discharge: 2020-09-28 | Disposition: A | Discharge status: Home / Self Care | Payer: Medicaid Other | Attending: Internal Medicine | Admitting: Internal Medicine

## 2020-09-28 DIAGNOSIS — H1032 Unspecified acute conjunctivitis, left eye: Secondary | ICD-10-CM | POA: Diagnosis not present

## 2020-09-28 DIAGNOSIS — E039 Hypothyroidism, unspecified: Secondary | ICD-10-CM

## 2020-09-28 MED ORDER — ERYTHROMYCIN 5 MG/GM OP OINT: TOPICAL_OINTMENT | OPHTHALMIC | 0 refills | Status: DC

## 2020-09-28 MED ORDER — LEVOTHYROXINE SODIUM 175 MCG PO TABS: 175.0000 ug | ORAL_TABLET | Freq: Every day | ORAL | 0 refills | Status: DC

## 2020-09-28 NOTE — ED Triage Notes (Addendum)
Patient c/o LFT eye swelling and itching x 4 days.   Patient denies any trauma to eye.   Patient endorses drainage.   Patient endorses worsening symptoms upon waking in the morning.   Patient endorses "foggy" vision at times.   Patient hasn't tried any medications for symptoms.

## 2020-09-28 NOTE — ED Provider Notes (Signed)
[facility]    CSN: [id number] Arrival date & time: 09/28/20  0913      History   Chief Complaint Chief Complaint  Patient presents with   Eye Problem    HPI Terri Glenn is a 25 y.o. female.   Patient presenting today with 4-day history of left eye redness, itching, thick drainage.  Denies injury to the eye, headache, nausea, vomiting, vision changes, fever, chills, upper respiratory symptoms.  No known new exposures to chemicals or environmental irritants.  So far not tried anything over-the-counter for symptoms.  No known history of chronic ophthalmologic issues.   Past Medical History:  Diagnosis Date   Hypothyroidism    Thyroid disease     Patient Active Problem List   Diagnosis Date Noted   Screening examination for STD (sexually transmitted disease) 08/13/2020   Vaginal discharge 07/15/2019   Encounter for initial prescription of contraceptive pills 07/15/2019   Hypothyroid 02/05/2016    Past Surgical History:  Procedure Laterality Date   CESAREAN SECTION N/A 12/31/2015   Procedure: CESAREAN SECTION;  Surgeon: Levie Heritage, DO;  Location: [facility] BIRTHING SUITES;  Service: Obstetrics;  Laterality: N/A;   NO PAST SURGERIES      OB History     Gravida  1   Para  1   Term  1   Preterm  0   AB  0   Living  1      SAB  0   IAB  0   Ectopic  0   Multiple  0   Live Births  1            Home Medications    Prior to Admission medications   Medication Sig Start Date End Date Taking? Authorizing Provider  erythromycin ophthalmic ointment Place a 1/2 inch ribbon of ointment into the left lower eyelid BID. 09/28/20  Yes Particia Nearing, PA-C  levothyroxine (SYNTHROID) 175 MCG tablet Take 1 tablet (175 mcg total) by mouth daily before breakfast. 09/06/20  Yes Erick Alley, DO  acetaminophen (TYLENOL) 325 MG tablet Take 2 tablets (650 mg total) by mouth every 4 (four) hours as needed (for pain scale < 4). Patient not taking:  No sig reported 01/03/16   Levie Heritage, DO  cetirizine (ZYRTEC) 10 MG tablet Take 1 tablet (10 mg total) by mouth daily. Patient not taking: No sig reported 10/29/18   Leeroy Bock, MD  fluconazole (DIFLUCAN) 150 MG tablet Take 1 tablet (150 mg total) by mouth every 3 (three) days. Patient not taking: No sig reported 12/01/19   Allayne Stack, DO  norgestimate-ethinyl estradiol (ORTHO-CYCLEN) 0.25-35 MG-MCG tablet Take 1 tablet by mouth daily. Start at first day of menses Patient not taking: No sig reported 07/15/19   Marthenia Rolling, DO    Family History Family History  Problem Relation Age of Onset   Asthma Brother     Social History Social History   Tobacco Use   Smoking status: Never   Smokeless tobacco: Never  Substance Use Topics   Alcohol use: No   Drug use: No     Allergies   Patient has no known allergies.   Review of Systems Review of Systems Per HPI  Physical Exam Triage Vital Signs ED Triage Vitals  Enc Vitals Group     BP 09/28/20 0924 116/74     Pulse Rate 09/28/20 0924 73     Resp 09/28/20 0925 15     Temp 09/28/20 3790  98.4 F (36.9 C)     Temp Source 09/28/20 0924 Oral     SpO2 09/28/20 0924 97 %     Weight --      Height --      Head Circumference --      Peak Flow --      Pain Score 09/28/20 0927 0     Pain Loc --      Pain Edu? --      Excl. in GC? --    No data found.  Updated Vital Signs BP 116/74 (BP Location: Left Arm)   Pulse 73   Temp 98.4 F (36.9 C) (Oral)   Resp 15   LMP 09/09/2020 (Approximate)   SpO2 97%   Visual Acuity Right Eye Distance: 20/70 Left Eye Distance: 20/70 Bilateral Distance: 20/40  Right Eye Near:   Left Eye Near:    Bilateral Near:     Physical Exam Vitals and nursing note reviewed.  Constitutional:      Appearance: Normal appearance. She is not ill-appearing.  HENT:     Head: Atraumatic.     Nose: Nose normal.     Mouth/Throat:     Mouth: Mucous membranes are moist.     Pharynx:  Oropharynx is clear.  Eyes:     General:        Left eye: Discharge present.    Extraocular Movements: Extraocular movements intact.     Pupils: Pupils are equal, round, and reactive to light.     Comments: Left conjunctiva erythematous and injected.  No foreign body noted  Cardiovascular:     Rate and Rhythm: Normal rate and regular rhythm.     Heart sounds: Normal heart sounds.  Pulmonary:     Effort: Pulmonary effort is normal.     Breath sounds: Normal breath sounds.  Musculoskeletal:        General: Normal range of motion.     Cervical back: Normal range of motion and neck supple.  Skin:    General: Skin is warm and dry.  Neurological:     Mental Status: She is alert and oriented to person, place, and time.  Psychiatric:        Mood and Affect: Mood normal.        Thought Content: Thought content normal.        Judgment: Judgment normal.    UC Treatments / Results  Labs (all labs ordered are listed, but only abnormal results are displayed) Labs Reviewed - No data to display  EKG   Radiology No results found.  Procedures Procedures (including critical care time)  Medications Ordered in UC Medications - No data to display  Initial Impression / Assessment and Plan / UC Course  I have reviewed the triage vital signs and the nursing notes.  Pertinent labs & imaging results that were available during my care of the patient were reviewed by me and considered in my medical decision making (see chart for details).     Suspect conjunctivitis, possibly bacterial.  We will treat with erythromycin ointment and warm compresses.  Visual acuity at baseline per patient.  Return for worsening symptoms.  Final Clinical Impressions(s) / UC Diagnoses   Final diagnoses:  Acute bacterial conjunctivitis of left eye   Discharge Instructions   None    ED Prescriptions     Medication Sig Dispense Auth. Provider   erythromycin ophthalmic ointment Place a 1/2 inch ribbon of  ointment into the left lower eyelid BID.  3.5 g Particia Nearing, [place]      PDMP not reviewed this encounter.   Particia Nearing, [place] 09/28/20 1037

## 2020-10-31 ENCOUNTER — Other Ambulatory Visit: Payer: Self-pay | Admitting: Student

## 2020-10-31 DIAGNOSIS — E039 Hypothyroidism, unspecified: Secondary | ICD-10-CM

## 2020-12-10 ENCOUNTER — Other Ambulatory Visit: Payer: Self-pay | Admitting: Student

## 2020-12-10 DIAGNOSIS — E039 Hypothyroidism, unspecified: Secondary | ICD-10-CM

## 2020-12-11 MED ORDER — LEVOTHYROXINE SODIUM 175 MCG PO TABS: 175.0000 ug | ORAL_TABLET | Freq: Every day | ORAL | 0 refills | Status: DC

## 2021-03-09 ENCOUNTER — Other Ambulatory Visit: Payer: Self-pay | Admitting: Student

## 2021-03-09 DIAGNOSIS — E039 Hypothyroidism, unspecified: Secondary | ICD-10-CM

## 2021-04-22 ENCOUNTER — Ambulatory Visit (HOSPITAL_COMMUNITY)
Admission: EM | Admit: 2021-04-22 | Discharge: 2021-04-22 | Disposition: A | Discharge status: Home / Self Care | Payer: Medicaid Other | Attending: Internal Medicine | Admitting: Internal Medicine

## 2021-04-22 ENCOUNTER — Encounter (HOSPITAL_COMMUNITY): Payer: Self-pay | Admitting: Emergency Medicine

## 2021-04-22 DIAGNOSIS — B3731 Acute candidiasis of vulva and vagina: Secondary | ICD-10-CM | POA: Insufficient documentation

## 2021-04-22 MED ORDER — FLUCONAZOLE 150 MG PO TABS: 150.0000 mg | ORAL_TABLET | Freq: Once | ORAL | 0 refills | Status: AC

## 2021-04-22 NOTE — ED Triage Notes (Signed)
Pt reports vaginal discharge that is white for a few days. Denies pains or odors.  ?

## 2021-04-22 NOTE — Discharge Instructions (Addendum)
Take medications as prescribed We will call you with recommendations if labs are abnormal Return to urgent care if symptoms worsen. 

## 2021-04-23 LAB — CERVICOVAGINAL ANCILLARY ONLY
Bacterial Vaginitis (gardnerella): NEGATIVE
Candida Glabrata: NEGATIVE
Candida Vaginitis: POSITIVE — AB
Comment: NEGATIVE
Comment: NEGATIVE
Comment: NEGATIVE

## 2021-04-23 NOTE — ED Provider Notes (Signed)
?[facility] ? ? ? ?CSN: [id number] ?Arrival date & time: 04/22/21  0831 ? ? ?  ? ?History   ?Chief Complaint ?Chief Complaint  ?Patient presents with  ? Vaginal Discharge  ? ? ?HPI ?Nayzeth Altman is a 26 y.o. female comes to the urgent care with whitish vaginal discharge for 3 days duration.  Patient denies any dysuria urgency or frequency.  She denies lower abdominal pain.  Patient is sexually active with no concerns for STIs.  No deep dyspareunia.  No fever or chills..  ? ?HPI ? ?Past Medical History:  ?Diagnosis Date  ? Hypothyroidism   ? Thyroid disease   ? ? ?Patient Active Problem List  ? Diagnosis Date Noted  ? Screening examination for STD (sexually transmitted disease) 08/13/2020  ? Vaginal discharge 07/15/2019  ? Encounter for initial prescription of contraceptive pills 07/15/2019  ? Hypothyroid 02/05/2016  ? ? ?Past Surgical History:  ?Procedure Laterality Date  ? CESAREAN SECTION N/A 12/31/2015  ? Procedure: CESAREAN SECTION;  Surgeon: Levie Heritage, DO;  Location: [facility]IRTHING SUIT[facility];  Service: Obstetrics;  Laterality: N/A;  ? NO PAST SURGERIES    ? ? ?OB History   ? ? Gravida  ?1  ? Para  ?1  ? Term  ?1  ? Preterm  ?0  ? AB  ?0  ? Living  ?1  ?  ? ? SAB  ?0  ? IAB  ?0  ? Ectopic  ?0  ? Multiple  ?0  ? Live Births  ?1  ?   ?  ?  ? ? ? ?Home Medications   ? ?Prior to Admission medications   ?Medication Sig Start Date End Date Taking? Authorizing Provider  ?levothyroxine (SYNTHROID) 175 MCG tablet TAKE 1 TABLET(175 MCG) BY MOUTH DAILY BEFORE BREAKFAST 03/11/21   Erick Alley, DO  ? ? ?Family History ?Family History  ?Problem Relation Age of Onset  ? Asthma Brother   ? ? ?Social History ?Social History  ? ?Tobacco Use  ? Smoking status: Never  ? Smokeless tobacco: Never  ?Substance Use Topics  ? Alcohol use: No  ? Drug use: No  ? ? ? ?Allergies   ?Patient has no known allergies. ? ? ?Review of Systems ?Review of Systems  ?Respiratory: Negative.    ?Gastrointestinal: Negative.    ?Genitourinary:  Positive for vaginal discharge. Negative for dyspareunia, dysuria, frequency, vaginal bleeding and vaginal pain.  ? ? ?Physical Exam ?Triage Vital Signs ?ED Triage Vitals  ?Enc Vitals Group  ?   BP 04/22/21 0909 110/76  ?   Pulse Rate 04/22/21 0909 71  ?   Resp 04/22/21 0909 15  ?   Temp 04/22/21 0909 98.9 ?F (37.2 ?C)  ?   Temp Source 04/22/21 0909 Oral  ?   SpO2 04/22/21 0909 97 %  ?   Weight --   ?   Height --   ?   Head Circumference --   ?   Peak Flow --   ?   Pain Score 04/22/21 0907 0  ?   Pain Loc --   ?   Pain Edu? --   ?   Excl. in GC? --   ? ?No data found. ? ?Updated Vital Signs ?BP 110/76 (BP Location: Left Arm)   Pulse 71   Temp 98.9 ?F (37.2 ?C) (Oral)   Resp 15   LMP 03/28/2021   SpO2 97%  ? ?Visual Acuity ?Right Eye Distance:   ?Left Eye Distance:   ?  Bilateral Distance:   ? ?Right Eye Near:   ?Left Eye Near:    ?Bilateral Near:    ? ?Physical Exam ?Vitals and nursing note reviewed.  ?Constitutional:   ?   General: She is not in acute distress. ?   Appearance: She is not ill-appearing.  ?Cardiovascular:  ?   Rate and Rhythm: Normal rate and regular rhythm.  ?Pulmonary:  ?   Effort: Pulmonary effort is normal.  ?   Breath sounds: Normal breath sounds.  ?Abdominal:  ?   General: Bowel sounds are normal.  ?   Palpations: Abdomen is soft.  ?   Tenderness: There is no abdominal tenderness.  ?   Hernia: No hernia is present.  ?Neurological:  ?   Mental Status: She is alert.  ? ? ? ?UC Treatments / Results  ?Labs ?(all labs ordered are listed, but only abnormal results are displayed) ?Labs Reviewed  ?CERVICOVAGINAL ANCILLARY ONLY  ? ? ?EKG ? ? ?Radiology ?No results found. ? ?Procedures ?Procedures (including critical care time) ? ?Medications Ordered in UC ?Medications - No data to display ? ?Initial Impression / Assessment and Plan / UC Course  ?I have reviewed the triage vital signs and the nursing notes. ? ?Pertinent labs & imaging results that were available during my care of the  patient were reviewed by me and considered in my medical decision making (see chart for details). ? ?  ? ?1.  Vaginal yeast infection: ?Fluconazole 150 milligrams x1 dose ?Cervical vaginal swab for yeast and bacterial vaginosis ?We will call patient with treatment options if labs are abnormal ?Return precautions given ?Final Clinical Impressions(s) / UC Diagnoses  ? ?Final diagnoses:  ?Vaginal yeast infection  ? ? ? ?Discharge Instructions   ? ?  ?Take medications as prescribed ?We will call you with recommendations if labs are abnormal. ?Return to urgent care if symptoms worsen. ? ? ?ED Prescriptions   ? ? Medication Sig Dispense Auth. Provider  ? fluconazole (DIFLUCAN) 150 MG tablet Take 1 tablet (150 mg total) by mouth once for 1 dose. 2 tablet Corwyn Vora, Britta Mccreedy, MD  ? ?  ? ?PDMP not reviewed this encounter. ?  ?Merrilee Jansky, MD ?04/23/21 1021 ? ?

## 2021-05-13 ENCOUNTER — Ambulatory Visit (INDEPENDENT_AMBULATORY_CARE_PROVIDER_SITE_OTHER): Payer: Medicaid Other | Admitting: Student

## 2021-05-13 ENCOUNTER — Encounter: Payer: Self-pay | Admitting: Student

## 2021-05-13 ENCOUNTER — Other Ambulatory Visit (HOSPITAL_COMMUNITY)
Admission: RE | Admit: 2021-05-13 | Discharge: 2021-05-13 | Disposition: A | Discharge status: Home / Self Care | Payer: Medicaid Other | Source: Ambulatory Visit | Attending: Family Medicine | Admitting: Family Medicine

## 2021-05-13 VITALS — BP 105/73 | HR 65 | Ht 62.0 in | Wt 163.8 lb

## 2021-05-13 DIAGNOSIS — Z113 Encounter for screening for infections with a predominantly sexual mode of transmission: Secondary | ICD-10-CM | POA: Diagnosis not present

## 2021-05-13 DIAGNOSIS — Z3009 Encounter for other general counseling and advice on contraception: Secondary | ICD-10-CM

## 2021-05-13 DIAGNOSIS — Z30011 Encounter for initial prescription of contraceptive pills: Secondary | ICD-10-CM | POA: Diagnosis not present

## 2021-05-13 DIAGNOSIS — Z124 Encounter for screening for malignant neoplasm of cervix: Secondary | ICD-10-CM

## 2021-05-13 DIAGNOSIS — Z114 Encounter for screening for human immunodeficiency virus [HIV]: Secondary | ICD-10-CM

## 2021-05-13 DIAGNOSIS — E039 Hypothyroidism, unspecified: Secondary | ICD-10-CM

## 2021-05-13 LAB — POCT URINE PREGNANCY: Preg Test, Ur: NEGATIVE

## 2021-05-13 MED ORDER — NORGESTIMATE-ETH ESTRADIOL 0.25-35 MG-MCG PO TABS: 1.0000 | ORAL_TABLET | Freq: Every day | ORAL | 11 refills | Status: AC

## 2021-05-13 NOTE — Assessment & Plan Note (Signed)
-  Check TSH today ?-Continue levothyroxine 175 mcg daily  ?

## 2021-05-13 NOTE — Assessment & Plan Note (Signed)
Urine pregnancy test negative.  We thoroughly discussed all options for contraception and patient requested to start the pill.  ?-Ortho-Cyclen prescribed ?

## 2021-05-13 NOTE — Assessment & Plan Note (Signed)
-  RPR, HIV, G/C, trich tests today ?

## 2021-05-13 NOTE — Progress Notes (Signed)
? ? ?  SUBJECTIVE:  ? ?CHIEF COMPLAINT / HPI:  ? ?Hypothyroidism ?Last TSH on 09/06/2020 of 2.71.  Patient currently takes levothyroxine 175 mcg daily. States she feels she has lost weight in her abdomen and thighs.  Upon chart review, weight is stable from last visit in August 2022.  Denies any palpitations, issues with BMs. Admits to dry skin.  ? ?Need for Pap smear ?Last pap in 2019 normal. Needs routine pap today. Would like STI tested along with pap. Has had same partner for past 4 years, does not use condoms.  ? ?Need for contraception ?Pt currently has unprotected sex with the same partner for 4 years.  She is requesting to start birth control pill today. ? ?PERTINENT  PMH / PSH: hypothyroidism ? ?OBJECTIVE:  ? ?Vitals:  ? 05/13/21 0839  ?BP: 105/73  ?Pulse: 65  ?SpO2: 100%  ? ? ? ?General: NAD, pleasant, able to participate in exam ?Cardiac: RRR, no murmurs. ?Respiratory: CTAB, normal effort, No wheezes, rales or rhonchi ?Genitourinary: Chaperoned by CMA April.  Normal female external genitalia with no lesions, ulcers, warts.  Pink moist vaginal mucosa with no discharge noted from cervical os.  Normal-appearing cervix with no lesions or masses. ?Skin: warm and dry, no rashes noted ?Neuro: alert, no obvious focal deficits ?Psych: Normal affect and mood ? ?ASSESSMENT/PLAN:  ? ?Hypothyroid ?-Check TSH today ?-Continue levothyroxine 175 mcg daily  ? ?Encounter for initial prescription of contraceptive pills ?Urine pregnancy test negative.  We thoroughly discussed all options for contraception and patient requested to start the pill.  ?-Ortho-Cyclen prescribed ? ?Routine screening for STI (sexually transmitted infection) ?-RPR, HIV, G/C, trich tests today ?  ?-Routine Pap smear completed today ? ? ?Dr. Precious Gilding, DO ?[place]  ? ? ? ? ?

## 2021-05-14 ENCOUNTER — Other Ambulatory Visit: Payer: Self-pay | Admitting: Student

## 2021-05-14 ENCOUNTER — Telehealth: Payer: Self-pay | Admitting: Student

## 2021-05-14 DIAGNOSIS — E039 Hypothyroidism, unspecified: Secondary | ICD-10-CM

## 2021-05-14 LAB — CYTOLOGY - PAP
Chlamydia: NEGATIVE
Comment: NEGATIVE
Comment: NEGATIVE
Comment: NORMAL
Diagnosis: NEGATIVE
Neisseria Gonorrhea: NEGATIVE
Trichomonas: NEGATIVE

## 2021-05-14 LAB — HIV ANTIBODY (ROUTINE TESTING W REFLEX): HIV Screen 4th Generation wRfx: NONREACTIVE

## 2021-05-14 LAB — TSH: TSH: 0.277 u[IU]/mL — ABNORMAL LOW (ref 0.450–4.500)

## 2021-05-14 MED ORDER — LEVOTHYROXINE SODIUM 150 MCG PO TABS: 150.0000 ug | ORAL_TABLET | ORAL | 3 refills | Status: DC

## 2021-05-14 NOTE — Telephone Encounter (Signed)
Called patient to discuss TSH results indicating lower dose of synthroid is needed. Lower prescription of Synthroid has been sent to pharmacy. Pt voiced understanding that she will begin taking the new prescription and return for a lab only visit in 6 weeks to check a TSH.  ?

## 2021-05-14 NOTE — Progress Notes (Signed)
TSH ordered for 6 weeks as synthroid dose has been lowered.  ?

## 2021-05-15 LAB — T PALLIDUM ANTIBODY, EIA: T pallidum Antibody, EIA: NEGATIVE

## 2021-05-15 LAB — RPR W/REFLEX TO TREPSURE: RPR: NONREACTIVE

## 2021-06-14 ENCOUNTER — Other Ambulatory Visit: Payer: Self-pay | Admitting: Student

## 2021-06-14 DIAGNOSIS — E039 Hypothyroidism, unspecified: Secondary | ICD-10-CM

## 2021-07-02 ENCOUNTER — Encounter: Payer: Self-pay | Admitting: *Deleted

## 2022-04-14 ENCOUNTER — Ambulatory Visit (INDEPENDENT_AMBULATORY_CARE_PROVIDER_SITE_OTHER): Payer: Medicaid Other | Admitting: Student

## 2022-04-14 ENCOUNTER — Encounter: Payer: Self-pay | Admitting: Student

## 2022-04-14 VITALS — BP 100/65 | HR 67 | Ht 61.0 in | Wt 155.4 lb

## 2022-04-14 DIAGNOSIS — E039 Hypothyroidism, unspecified: Secondary | ICD-10-CM

## 2022-04-14 NOTE — Patient Instructions (Signed)
It was great to see you! Thank you for allowing me to participate in your care!  Our plans for today:  - continue your current dose of Synthroid for now. If we need to change the dose I will let you know - It is safe to take a multivitamin such as a women's on a day or prenatal vitamin if you choose to  We are checking some labs today, I will call you if they are abnormal will send you a MyChart message or a letter if they are normal.  If you do not hear about your labs in the next 2 weeks please let us know.  Take care and seek immediate care sooner if you develop any concerns.   Dr. Precious Gilding, DO [facility]

## 2022-04-14 NOTE — Progress Notes (Addendum)
    SUBJECTIVE:   CHIEF COMPLAINT / HPI:   Hypothyroidism Last TSH 0.277 05/13/2021 and synthroid dose was lowered. Pt did not return for recheck after lowering dose. Currently takes 150 mcg synthroid daily and doing well. No concerns at this time.    OBJECTIVE:   BP 100/65   Pulse 67   Ht 5\' 1"  (1.549 m)   Wt 155 lb 6.4 oz (70.5 kg)   SpO2 98%   BMI 29.36 kg/m    General: NAD, pleasant, able to participate in exam Cardiac: RRR, no murmurs. Respiratory: breathing comfortably on RA Skin: warm and dry Neuro: alert, no obvious focal deficits Psych: Normal affect and mood  ASSESSMENT/PLAN:   Hypothyroid TSH today. Pt advised to continue on current dose of synthroid. Will titrate based on results if needed.     Pt to return for labs based on TSH results and as needed   Dr. Precious Gilding, [place]

## 2022-04-15 LAB — TSH: TSH: 1.23 u[IU]/mL (ref 0.450–4.500)

## 2022-04-15 NOTE — Assessment & Plan Note (Signed)
TSH today. Pt advised to continue on current dose of synthroid. Will titrate based on results if needed.

## 2022-06-07 ENCOUNTER — Other Ambulatory Visit: Payer: Self-pay | Admitting: Student

## 2023-03-02 ENCOUNTER — Encounter: Payer: Self-pay | Admitting: Student

## 2023-04-21 ENCOUNTER — Encounter: Payer: Self-pay | Admitting: Student

## 2023-04-21 ENCOUNTER — Ambulatory Visit: Admitting: Student

## 2023-04-21 VITALS — BP 122/76 | HR 78 | Ht 61.0 in | Wt 177.4 lb

## 2023-04-21 DIAGNOSIS — E039 Hypothyroidism, unspecified: Secondary | ICD-10-CM

## 2023-04-21 DIAGNOSIS — E669 Obesity, unspecified: Secondary | ICD-10-CM | POA: Diagnosis not present

## 2023-04-21 DIAGNOSIS — R09A2 Foreign body sensation, throat: Secondary | ICD-10-CM | POA: Diagnosis not present

## 2023-04-21 DIAGNOSIS — J3489 Other specified disorders of nose and nasal sinuses: Secondary | ICD-10-CM | POA: Diagnosis not present

## 2023-04-21 DIAGNOSIS — M7989 Other specified soft tissue disorders: Secondary | ICD-10-CM | POA: Diagnosis not present

## 2023-04-21 LAB — POCT GLYCOSYLATED HEMOGLOBIN (HGB A1C): Hemoglobin A1C: 5.5 % (ref 4.0–5.6)

## 2023-04-21 MED ORDER — FLUTICASONE PROPIONATE 50 MCG/ACT NA SUSP: 1.0000 | Freq: Every day | NASAL | 1 refills | Status: AC

## 2023-04-21 NOTE — Patient Instructions (Signed)
 It was great to see you! Thank you for allowing me to participate in your care!  I recommend that you always bring your medications to each appointment as this makes it easy to ensure you are on the correct medications and helps [place] not miss when refills are needed.  Our plans for today:   - Goal: cardio 15-20 minutes 3x/week - look into - [employer] cardio for beginners, tag with daughter, going for a fast walk/jog, anything else that gets your heart rate up  - Soda no more than 2x/week - alcohol no more than 3x/week   We are checking some labs today, I will call you if they are abnormal will send you a MyChart message or a letter if they are normal.  If you do not hear about your labs in the next 2 weeks please let us know.  Take care and seek immediate care sooner if you develop any concerns.   Dr. Erick Alley, DO [facility]

## 2023-04-21 NOTE — Progress Notes (Unsigned)
    SUBJECTIVE:   CHIEF COMPLAINT / HPI:   Pt presents for a routine thyroid check.   She has been compliant with her thyroid medication She also reports occasional mild swelling of her fingers, occurring once or twice a week, which she attributes to her intake of salty foods. The swelling resolves on its own. Over the past year, the patient has noticed weight gain, which she attributes to a less active lifestyle. She reports a change in her diet and a decrease in physical activity. She expresses a desire to address her weight gain and is open to discussing lifestyle modifications.  The patient also reports a sensation of something stuck in her throat when swallowing, which has been present for about a month. She has noticed this sensation more frequently recently and reports no difficulty swallowing food or drinks. Also notes rhinorrhea for past ~ month, denies congestion.  Started a new job recently and is having to wake up earlier and only getting ~ 5 hours of sleep each night which has caused fatigue.  PERTINENT  PMH / PSH: Hypothyroidism   OBJECTIVE:   BP 122/76   Pulse 78   Ht 5\' 1"  (1.549 m)   Wt 177 lb 6.4 oz (80.5 kg)   LMP 03/31/2023   SpO2 98%   BMI 33.52 kg/m    General: NAD, pleasant, able to participate in exam HEENT:  no erythema, exudate or selling of oropharynx, cobblestoning present on posterior pharynx. Right nasal turbinate swollen. No cervical or submandibular lymphadenopathy  Cardiac: RRR, no murmurs. Respiratory: CTAB, normal effort, No wheezes, rales or rhonchi Extremities: no edema BLEs or of fingers Skin: warm and dry Neuro: alert, no obvious focal deficits Psych: Normal affect and mood  ASSESSMENT/PLAN:   Obesity (BMI 30-39.9) Gained over twenty pounds in the past year due to reduced physical activity and dietary habits. Prefers lifestyle modifications at this time. A1c WNL.  We made the following goals today: - Set exercise goal of cardio three  times per week for 15-20 minutes.   - Limit soda to no more than two times per week.   - Limit alcohol to no more than three times per week.   - Recommend keeping a food diary for a week before the next visit.   -f/u in 1 month   Hypothyroid On thyroid medication with last year's levels normal.  - TSH today, continue synthroid 150 mcg daily for now  Finger swelling Intermittent finger swelling likely related to dietary salt intake.   - Advise to reduce salty food intake.   - Report if swelling persists despite dietary changes.    Globus sensation Intermittent sensation of something stuck in throat possibly related to anxiety and/or post-nasal drip (has cobblestoning on exam) - Prescribe Flonase nasal spray, one spray in each nostril at bedtime.   - CTM, return sooner if symptoms worsen     Dr. Erick Alley, DO [facility]

## 2023-04-22 DIAGNOSIS — J3489 Other specified disorders of nose and nasal sinuses: Secondary | ICD-10-CM | POA: Insufficient documentation

## 2023-04-22 DIAGNOSIS — E669 Obesity, unspecified: Secondary | ICD-10-CM | POA: Insufficient documentation

## 2023-04-22 DIAGNOSIS — R09A2 Foreign body sensation, throat: Secondary | ICD-10-CM | POA: Insufficient documentation

## 2023-04-22 DIAGNOSIS — M7989 Other specified soft tissue disorders: Secondary | ICD-10-CM | POA: Insufficient documentation

## 2023-04-22 LAB — TSH: TSH: 3.3 u[IU]/mL (ref 0.450–4.500)

## 2023-04-22 NOTE — Assessment & Plan Note (Signed)
 Intermittent finger swelling likely related to dietary salt intake.   - Advise to reduce salty food intake.   - Report if swelling persists despite dietary changes.

## 2023-04-22 NOTE — Assessment & Plan Note (Signed)
 On thyroid medication with last year's levels normal.  - TSH today, continue synthroid 150 mcg daily for now

## 2023-04-22 NOTE — Assessment & Plan Note (Signed)
 Intermittent sensation of something stuck in throat possibly related to anxiety and/or post-nasal drip (has cobblestoning on exam) - Prescribe Flonase nasal spray, one spray in each nostril at bedtime.   - CTM, return sooner if symptoms worsen

## 2023-04-22 NOTE — Assessment & Plan Note (Addendum)
 Gained over twenty pounds in the past year due to reduced physical activity and dietary habits. Prefers lifestyle modifications at this time. A1c WNL.  We made the following goals today: - Set exercise goal of cardio three times per week for 15-20 minutes.   - Limit soda to no more than two times per week.   - Limit alcohol to no more than three times per week.   - Recommend keeping a food diary for a week before the next visit.   -f/u in 1 month

## 2023-05-18 ENCOUNTER — Ambulatory Visit: Admitting: Student

## 2023-05-18 NOTE — Progress Notes (Deleted)
    SUBJECTIVE:   CHIEF COMPLAINT / HPI:  We made the following goals last month: - Set exercise goal of cardio three times per week for 15-20 minutes.   - Limit soda to no more than two times per week.   - Limit alcohol to no more than three times per week.   - Recommend keeping a food diary for a week before the next visit.   -f/u in 1 month  PERTINENT  PMH / PSH: ***  OBJECTIVE:   LMP 03/31/2023  ***  General: NAD, pleasant, able to participate in exam Cardiac: RRR, no murmurs. Respiratory: CTAB, normal effort, No wheezes, rales or rhonchi Abdomen: Bowel sounds present, nontender, nondistended, no hepatosplenomegaly. Extremities: no edema or cyanosis. Skin: warm and dry, no rashes noted Neuro: alert, no obvious focal deficits Psych: Normal affect and mood  ASSESSMENT/PLAN:   No problem-specific Assessment & Plan notes found for this encounter.     Dr. Glenn Lange, DO  [facility]    {    This will disappear when note is signed, click to select method of visit    :1}

## 2023-07-07 ENCOUNTER — Encounter: Payer: Self-pay | Admitting: *Deleted

## 2023-08-24 ENCOUNTER — Other Ambulatory Visit: Payer: Self-pay

## 2023-08-24 MED ORDER — LEVOTHYROXINE SODIUM 150 MCG PO TABS: 150.0000 ug | ORAL_TABLET | Freq: Every day | ORAL | 3 refills | Status: AC
